# Patient Record
Sex: Male | Born: 1965 | Race: Black or African American | Hispanic: No | Marital: Married | Smoking: Never smoker
Health system: Southern US, Community
[De-identification: ages and names within clinical notes are randomized; demographics above are authoritative.]

## PROBLEM LIST (undated history)

## (undated) DIAGNOSIS — G4730 Sleep apnea, unspecified: Secondary | ICD-10-CM

## (undated) DIAGNOSIS — M1990 Unspecified osteoarthritis, unspecified site: Secondary | ICD-10-CM

## (undated) DIAGNOSIS — D126 Benign neoplasm of colon, unspecified: Secondary | ICD-10-CM

## (undated) DIAGNOSIS — I10 Essential (primary) hypertension: Secondary | ICD-10-CM

## (undated) DIAGNOSIS — M109 Gout, unspecified: Secondary | ICD-10-CM

## (undated) DIAGNOSIS — E785 Hyperlipidemia, unspecified: Secondary | ICD-10-CM

## (undated) DIAGNOSIS — K5790 Diverticulosis of intestine, part unspecified, without perforation or abscess without bleeding: Secondary | ICD-10-CM

## (undated) DIAGNOSIS — T7840XA Allergy, unspecified, initial encounter: Secondary | ICD-10-CM

## (undated) DIAGNOSIS — M5020 Other cervical disc displacement, unspecified cervical region: Secondary | ICD-10-CM

## (undated) HISTORY — DX: Sleep apnea, unspecified: G47.30

## (undated) HISTORY — DX: Allergy, unspecified, initial encounter: T78.40XA

## (undated) HISTORY — DX: Unspecified osteoarthritis, unspecified site: M19.90

## (undated) HISTORY — DX: Hyperlipidemia, unspecified: E78.5

## (undated) HISTORY — DX: Essential (primary) hypertension: I10

## (undated) HISTORY — DX: Diverticulosis of intestine, part unspecified, without perforation or abscess without bleeding: K57.90

## (undated) HISTORY — PX: SHOULDER SURGERY: SHX246

## (undated) HISTORY — DX: Benign neoplasm of colon, unspecified: D12.6

---

## 1999-04-14 ENCOUNTER — Emergency Department (HOSPITAL_COMMUNITY): Admission: EM | Admit: 1999-04-14 | Discharge: 1999-04-14 | Payer: Self-pay | Admitting: Emergency Medicine

## 1999-04-15 ENCOUNTER — Encounter: Payer: Self-pay | Admitting: Emergency Medicine

## 2002-09-03 ENCOUNTER — Ambulatory Visit (HOSPITAL_COMMUNITY): Admission: RE | Admit: 2002-09-03 | Discharge: 2002-09-03 | Payer: Self-pay | Admitting: Sports Medicine

## 2005-11-07 ENCOUNTER — Ambulatory Visit (HOSPITAL_BASED_OUTPATIENT_CLINIC_OR_DEPARTMENT_OTHER): Admission: RE | Admit: 2005-11-07 | Discharge: 2005-11-07 | Payer: Self-pay | Admitting: Family Medicine

## 2005-11-13 ENCOUNTER — Ambulatory Visit: Payer: Self-pay | Admitting: Internal Medicine

## 2005-12-07 ENCOUNTER — Ambulatory Visit (HOSPITAL_BASED_OUTPATIENT_CLINIC_OR_DEPARTMENT_OTHER): Admission: RE | Admit: 2005-12-07 | Discharge: 2005-12-07 | Payer: Self-pay | Admitting: Family Medicine

## 2008-10-02 ENCOUNTER — Emergency Department (HOSPITAL_COMMUNITY): Admission: EM | Admit: 2008-10-02 | Discharge: 2008-10-02 | Payer: Self-pay | Admitting: Emergency Medicine

## 2009-04-30 ENCOUNTER — Ambulatory Visit (HOSPITAL_BASED_OUTPATIENT_CLINIC_OR_DEPARTMENT_OTHER): Admission: RE | Admit: 2009-04-30 | Discharge: 2009-05-01 | Payer: Self-pay | Admitting: Orthopedic Surgery

## 2010-11-19 LAB — BASIC METABOLIC PANEL
BUN: 6 mg/dL (ref 6–23)
CO2: 30 mEq/L (ref 19–32)
Calcium: 9.7 mg/dL (ref 8.4–10.5)
Chloride: 105 mEq/L (ref 96–112)
Creatinine, Ser: 0.94 mg/dL (ref 0.4–1.5)
GFR calc Af Amer: >60 mL/min (ref >60)
GFR calc non Af Amer: >60 mL/min (ref >60)
Glucose, Bld: 103 mg/dL — ABNORMAL HIGH (ref 70–99)
Potassium: 4.5 mEq/L (ref 3.5–5.1)
Sodium: 141 mEq/L (ref 135–145)

## 2010-11-19 LAB — POCT HEMOGLOBIN-HEMACUE: Hemoglobin: 16 g/dL (ref 13.0–17.0)

## 2010-12-31 NOTE — Procedures (Signed)
NAME:  Harold Myers, Harold Myers            ACCOUNT NO.:  [id number]   MEDICAL RECORD NO.:  [id number]          PATIENT TYPE:  OUT   LOCATION:  SLEEP CENTER                 FACILITY:  [facility]   PHYSICIAN:  Clinton D. Maple Hudson, M.D. DATE OF BIRTH:  11-12-1965   DATE OF STUDY:                              NOCTURNAL POLYSOMNOGRAM   REFERRING PHYSICIAN:  Dr. Tracey Harries.   INDICATION FOR STUDY:  Hypersomnia with sleep apnea.   EPWORTH SLEEPINESS SCORE:  07/24, BMI 32.5.  Weight 220 pounds.   MEDICATIONS:  No medication listed.  A baseline diagnostic NPSG on November 07, 2005, recorded an AHI of 113.8 per hour.  CPAP titration is requested.   SLEEP ARCHITECTURE:  Total sleep time 304 minutes with sleep efficiency 81%.  Stage 1 was 5%, stage 2 84%, stages 3 and 4 were absent, REM 11% of total  sleep time.  Sleep latency 48 minutes, REM latency 106 minutes, awake after  sleep onset 26 minutes, arousal index increased at 54.  No bedtime  medication taken.   RESPIRATORY DATA:  CPAP titration protocol.  CPAP was titrated to 15 CWP,  AHI 0 per hour.  A large ResMed ultra mirage full-face mask was used with a  heated humidifier.   OXYGEN DATA:  Snoring was prevented and oxygen saturation held at 94-98% on  room air with CPAP.   CARDIAC DATA:  Normal sinus rhythm.   MOVEMENT/PARASOMNIA:  Occasional limb jerks but with little effect on sleep.   IMPRESSION/RECOMMENDATION:  1.  Successful CPAP titration to 15 CWP, AHI 0 per hour.  A large ResMed      ultra mirage full-face mask was used with a heated humidifier.  2.  Baseline NPSG on November 07, 2005 had recorded an AHI of 113.8 per hour.      Clinton D. Maple Hudson, M.D.  Diplomate, Biomedical engineer of Sleep Medicine  Electronically Signed     CDY/MEDQ  D:  12/11/2005 12:18:12  T:  12/12/2005 10:53:41  Job:  [id number]

## 2010-12-31 NOTE — Procedures (Signed)
NAME:  Harold Myers, BALOGH            ACCOUNT NO.:  [id number]   MEDICAL RECORD NO.:  [id number]          PATIENT TYPE:  OUT   LOCATION:  SLEEP CENTER                 [facility]ACILITY:  [facility]   PHYSICIAN:  Clinton D. Maple Hudson, M.D. DATE OF BIRTH:  [birth year]   DATE OF STUDY:  11/07/2005                              NOCTURNAL POLYSOMNOGRAM   REFERRING PHYSICIAN:  Tracey Harries, M.D.   INDICATIONS FOR STUDY:  Hypersomnia with sleep apnea.  Epworth sleepiness  score 8/24, BMI 32.  Weight 215 pounds.   HOME MEDICATIONS:  None listed.   An NPSG diagnostic protocol was ordered.   SLEEP ARCHITECTURE:  Total sleep time 379 minutes with sleep efficiency 86%.  Stage I was 24%, stage II 72%, stages III and IV absent, REM 4% of total  sleep time.  Sleep latency 24 minutes, REM latency 266 minutes, awake after  sleep onset 38 minutes, arousal index markedly increased at 98.8 per hour  indicating severe sleep fragmentation.  No bedtime medication taken.   RESPIRATORY DATA:  NPSG protocol.  Apnea/hypopnea index (AHI, RDI) 113.8  obstructive events per hour indicating very severe obstructive sleep  apnea/hypopnea syndrome.  This included 584 obstructive apneas and 136  hypopneas.  Events were not positional.  REM AHI 95.3 per hour.   OXYGEN DATA:  Loud to very loud snoring with oxygen desaturation to a nadir  of 77% on room air.  Mean oxygen saturation across the study was 91% on room  air.   CARDIAC DATA:  Normal sinus rhythm.   MOVEMENT/PARASOMNIA:  Occasional leg jerk, insignificant with no bathroom  trips.   IMPRESSION/RECOMMENDATIONS:  1.  Very severe obstructive sleep apnea slash hypopnea syndrome, AHI 113.8      obstructive events per hour with      nonpositional events, very loud snoring and oxygen desaturation to 77%.  2.  Consider return for CPAP titration or evaluate for alternative therapies      as appropriate.      Clinton D. Maple Hudson, M.D.  Diplomate, Biomedical engineer of Sleep Medicine  Electronically Signed     CDY/MEDQ  D:  11/13/2005 11:50:24  T:  11/14/2005 11:40:30  Job:  [id number]

## 2015-07-22 ENCOUNTER — Encounter: Payer: Self-pay | Admitting: Internal Medicine

## 2015-09-09 ENCOUNTER — Ambulatory Visit (AMBULATORY_SURGERY_CENTER): Payer: Self-pay | Admitting: *Deleted

## 2015-09-09 VITALS — Ht 70.0 in | Wt 238.2 lb

## 2015-09-09 DIAGNOSIS — Z1211 Encounter for screening for malignant neoplasm of colon: Secondary | ICD-10-CM

## 2015-09-09 MED ORDER — NA SULFATE-K SULFATE-MG SULF 17.5-3.13-1.6 GM/177ML PO SOLN: ORAL | Status: DC

## 2015-09-09 NOTE — Progress Notes (Signed)
No egg or soy allergy  No anesthesia or intubation problems per pt  No diet medications taken   

## 2015-09-23 ENCOUNTER — Encounter: Payer: Self-pay | Admitting: Internal Medicine

## 2015-09-23 ENCOUNTER — Ambulatory Visit (AMBULATORY_SURGERY_CENTER): Payer: Medicare Other | Admitting: Internal Medicine

## 2015-09-23 VITALS — BP 129/80 | HR 68 | Temp 97.7°F | Resp 16 | Ht 70.0 in | Wt 238.0 lb

## 2015-09-23 DIAGNOSIS — D124 Benign neoplasm of descending colon: Secondary | ICD-10-CM

## 2015-09-23 DIAGNOSIS — D123 Benign neoplasm of transverse colon: Secondary | ICD-10-CM

## 2015-09-23 DIAGNOSIS — Z1211 Encounter for screening for malignant neoplasm of colon: Secondary | ICD-10-CM

## 2015-09-23 DIAGNOSIS — K635 Polyp of colon: Secondary | ICD-10-CM

## 2015-09-23 MED ORDER — SODIUM CHLORIDE 0.9 % IV SOLN: 500.0000 mL | INTRAVENOUS | Status: DC

## 2015-09-23 NOTE — Progress Notes (Signed)
Stable to RR 

## 2015-09-23 NOTE — Progress Notes (Signed)
No problems noted in the recovery room. maw 

## 2015-09-23 NOTE — Op Note (Signed)
[facility]  [address] [place], [id number]   COLONOSCOPY PROCEDURE REPORT  PATIENT: Harold Myers, Harold Myers  MR#: [id number] BIRTHDATE: [birth year] , 80  yrs. old GENDER: male ENDOSCOPIST: Jerene Bears, MD PROCEDURE DATE:  09/23/2015 PROCEDURE:   Colonoscopy, screening and Colonoscopy with snare polypectomy First Screening Colonoscopy - Avg.  risk and is 50 yrs.  old or older Yes.  Prior Negative Screening - Now for repeat screening. N/A  History of Adenoma - Now for follow-up colonoscopy & has been > or = to 3 yrs.  N/A  Polyps removed today? Yes ASA CLASS:   Class II INDICATIONS:Screening for colonic neoplasia and Colorectal Neoplasm Risk Assessment for this procedure is average risk. MEDICATIONS: Monitored anesthesia care and Propofol 400 mg IV  DESCRIPTION OF PROCEDURE:   After the risks benefits and alternatives of the procedure were thoroughly explained, informed consent was obtained.  The digital rectal exam revealed no rectal mass.   The LB PCF Q180 [id number]  endoscope was introduced through the anus and advanced to the cecum, which was identified by both the appendix and ileocecal valve. No adverse events experienced. The quality of the prep was good.  (Suprep was used)  The instrument was then slowly withdrawn as the colon was fully examined. Estimated blood loss is zero unless otherwise noted in this procedure report.  COLON FINDINGS: Two sessile polyps ranging between 3-82mm in size were found in the transverse colon and descending colon. Polypectomies were performed with a cold snare.  The resection was complete, the polyp tissue was completely retrieved and sent to histology.   There was mild diverticulosis noted in the sigmoid colon.  Retroflexed views revealed internal hemorrhoids. The time to cecum = 3.3 Withdrawal time = 13.1   The scope was withdrawn and the procedure completed. COMPLICATIONS: There were no immediate complications.  ENDOSCOPIC  IMPRESSION: 1.   Two sessile polyps ranging between 3-48mm in size were found in the transverse colon and descending colon; polypectomies were performed with a cold snare 2.   Mild diverticulosis was noted in the sigmoid colon  RECOMMENDATIONS: 1.  Await pathology results 2.  High fiber diet 3.  If the polyps removed today are proven to be adenomatous (pre-cancerous) polyps, you will need a repeat colonoscopy in 5 years.  Otherwise you should continue to follow colorectal cancer screening guidelines for "routine risk" patients with colonoscopy in 10 years.  You will receive a letter within 1-2 weeks with the results of your biopsy as well as final recommendations.  Please call my office if you have not received a letter after 3 weeks.  eSigned:  Jerene Bears, MD 09/23/2015 10:02 AM   cc:  the patient

## 2015-09-23 NOTE — Progress Notes (Signed)
Called to room to assist during endoscopic procedure.  Patient ID and intended procedure confirmed with present staff. Received instructions for my participation in the procedure from the performing physician.  

## 2015-09-23 NOTE — Patient Instructions (Signed)
YOU HAD AN ENDOSCOPIC PROCEDURE TODAY AT THE [facility]:   Refer to the procedure report that was given to you for any specific questions about what was found during the examination.  If the procedure report does not answer your questions, please call your gastroenterologist to clarify.  If you requested that your care partner not be given the details of your procedure findings, then the procedure report has been included in a sealed envelope for you to review at your convenience later.  YOU SHOULD EXPECT: Some feelings of bloating in the abdomen. Passage of more gas than usual.  Walking can help get rid of the air that was put into your GI tract during the procedure and reduce the bloating. If you had a lower endoscopy (such as a colonoscopy or flexible sigmoidoscopy) you may notice spotting of blood in your stool or on the toilet paper. If you underwent a bowel prep for your procedure, you may not have a normal bowel movement for a few days.  Please Note:  You might notice some irritation and congestion in your nose or some drainage.  This is from the oxygen used during your procedure.  There is no need for concern and it should clear up in a day or so.  SYMPTOMS TO REPORT IMMEDIATELY:   Following lower endoscopy (colonoscopy or flexible sigmoidoscopy):  Excessive amounts of blood in the stool  Significant tenderness or worsening of abdominal pains  Swelling of the abdomen that is new, acute  Fever of 100F or higher   For urgent or emergent issues, a gastroenterologist can be reached at any hour by calling [phone].   DIET: Your first meal following the procedure should be a small meal and then it is ok to progress to your normal diet. Heavy or fried foods are harder to digest and may make you feel nauseous or bloated.  Likewise, meals heavy in dairy and vegetables can increase bloating.  Drink plenty of fluids but you should avoid alcoholic beverages for 24  hours.  ACTIVITY:  You should plan to take it easy for the rest of today and you should NOT DRIVE or use heavy machinery until tomorrow (because of the sedation medicines used during the test).    FOLLOW UP: Our staff will call the number listed on your records the next business day following your procedure to check on you and address any questions or concerns that you may have regarding the information given to you following your procedure. If we do not reach you, we will leave a message.  However, if you are feeling well and you are not experiencing any problems, there is no need to return our call.  We will assume that you have returned to your regular daily activities without incident.  If any biopsies were taken you will be contacted by phone or by letter within the next 1-3 weeks.  Please call us at [phone] if you have not heard about the biopsies in 3 weeks.    SIGNATURES/CONFIDENTIALITY: You and/or your care partner have signed paperwork which will be entered into your electronic medical record.  These signatures attest to the fact that that the information above on your After Visit Summary has been reviewed and is understood.  Full responsibility of the confidentiality of this discharge information lies with you and/or your care-partner.    Handouts were given to your care partner on polyps, diverticulosis, hemorrhoids, and a high fiber diet with liberal fluid intake.  You   may resume your current medications today. Await biopsy results. Please call if any questions or concerns.   

## 2015-09-24 ENCOUNTER — Telehealth: Payer: Self-pay

## 2015-09-24 NOTE — Telephone Encounter (Signed)
  Follow up Call-  Call back number 09/23/2015  Post procedure Call Back phone  # [phone]  Permission to leave phone message Yes     Patient was called after procedure on 09/23/2015. No answer at the number given for follow up. A message was left on the answering machine.

## 2015-09-28 ENCOUNTER — Encounter: Payer: Self-pay | Admitting: Internal Medicine

## 2020-05-18 ENCOUNTER — Other Ambulatory Visit: Payer: Self-pay | Admitting: General Surgery

## 2020-05-18 DIAGNOSIS — R1013 Epigastric pain: Secondary | ICD-10-CM

## 2020-06-09 ENCOUNTER — Other Ambulatory Visit: Payer: Self-pay

## 2020-06-09 ENCOUNTER — Ambulatory Visit
Admission: RE | Admit: 2020-06-09 | Discharge: 2020-06-09 | Disposition: A | Discharge status: Home / Self Care | Payer: Medicare Other | Source: Ambulatory Visit | Attending: General Surgery | Admitting: General Surgery

## 2020-06-09 DIAGNOSIS — R1013 Epigastric pain: Secondary | ICD-10-CM

## 2020-07-16 ENCOUNTER — Telehealth: Payer: Self-pay | Admitting: Internal Medicine

## 2020-09-18 ENCOUNTER — Encounter: Payer: Self-pay | Admitting: *Deleted

## 2020-09-24 ENCOUNTER — Encounter: Payer: Self-pay | Admitting: Internal Medicine

## 2020-09-24 ENCOUNTER — Ambulatory Visit (INDEPENDENT_AMBULATORY_CARE_PROVIDER_SITE_OTHER): Payer: Medicare Other | Admitting: Internal Medicine

## 2020-09-24 VITALS — BP 139/82 | HR 65 | Ht 70.0 in | Wt 250.6 lb

## 2020-09-24 DIAGNOSIS — Z860100 Personal history of colon polyps, unspecified: Secondary | ICD-10-CM

## 2020-09-24 DIAGNOSIS — K219 Gastro-esophageal reflux disease without esophagitis: Secondary | ICD-10-CM

## 2020-09-24 DIAGNOSIS — R1013 Epigastric pain: Secondary | ICD-10-CM

## 2020-09-24 MED ORDER — SUTAB 1479-225-188 MG PO TABS: ORAL_TABLET | ORAL | 0 refills | Status: DC

## 2020-09-24 NOTE — Patient Instructions (Signed)
You have been scheduled for an endoscopy and colonoscopy. Please follow the written instructions given to you at your visit today. Please pick up your prep supplies at the pharmacy within the next 1-3 days. If you use inhalers (even only as needed), please bring them with you on the day of your procedure.  If you are age 57 or younger, your body mass index should be between 19-25. Your Body mass index is 35.96 kg/m. If this is out of the aformentioned range listed, please consider follow up with your Primary Care Provider.   Due to recent changes in healthcare laws, you may see the results of your imaging and laboratory studies on MyChart before your provider has had a chance to review them.  We understand that in some cases there may be results that are confusing or concerning to you. Not all laboratory results come back in the same time frame and the provider may be waiting for multiple results in order to interpret others.  Please give [place] 48 hours in order for your provider to thoroughly review all the results before contacting the office for clarification of your results.

## 2020-09-24 NOTE — Progress Notes (Signed)
Patient ID: Harold Myers, male   DOB: 22-Aug-1965, 55 y.o.   MRN: [id number] HPI: Harold Myers is a 55 year old male with a history of nonadvanced adenoma of the colon, diverticulosis, hypertension, hyperlipidemia and sleep apnea who is seen to evaluate epigastric discomfort and abdominal bloating.  He is here alone today.  He is known to me from his screening colonoscopy which was performed 5 years ago on 09/23/2015.  This revealed 2 polyps ranging in size from 3 to 5 mm.  One from the transverse the other from the descending colon.  1 of these was an adenoma.  The other was benign colorectal mucosa.  There was sigmoid diverticulosis.  He reports that he has developed epigastric discomfort and "tightness".  This seems to be more prevalent after eating but also after doing heavy lifting and yard work.  He was using ice packs for relief.  He does take famotidine 20 mg once daily which he takes for history of reflux and regurgitation.  It was previously used twice daily but now once daily.  It does seem to help his heartburn and reflux symptoms.  His bowel movements are "smooth" though he does try to increase fiber in his diet and also uses a detoxification tea at night to help with bowel movement.  No blood in stool or melena.  He does use fiber Gummies on occasion.  He was using garlic and lemon juice to help cholesterol though he noticed this may have worsened his epigastric discomfort.  No dysphagia or odynophagia.  In the last several weeks his upper abdominal tightness and bloating and discomfort have been better but he started a digestive enzyme but she takes twice daily with food.  He was seen by [facility] Dr. Marlou Starks to evaluate abdominal pain.  This was predominantly epigastric and there was question of a possible ventral hernia or diastases recti.  CT scan of the abdomen was performed, see objective.  He reports he does have lab work routinely with primary care though I do not  have record of this lab work currently.  Past Medical History:  Diagnosis Date  . Allergy   . Arthritis   . Diverticulosis   . Hyperlipidemia   . Hypertension   . Sleep apnea    wears CPAP  . Tubular adenoma of colon     Past Surgical History:  Procedure Laterality Date  . SHOULDER SURGERY Left    rotator cuff, bone spur- left shoulder    Outpatient Medications Prior to Visit  Medication Sig Dispense Refill  . aspirin 81 MG chewable tablet Chew 81 mg by mouth daily.    . ergocalciferol (VITAMIN D2) 50000 units capsule Take 50,000 Units by mouth once a week.    . fluticasone (FLONASE) 50 MCG/ACT nasal spray Place 2 sprays into both nostrils daily.    Marland Kitchen gabapentin (NEURONTIN) 300 MG capsule Take 300 mg by mouth 3 (three) times daily as needed.    Marland Kitchen ibuprofen (ADVIL,MOTRIN) 800 MG tablet Take 800 mg by mouth every 8 (eight) hours as needed.    . loratadine (CLARITIN) 10 MG tablet Take 10 mg by mouth daily. Reported on 09/23/2015    . losartan (COZAAR) 50 MG tablet Take 50 mg by mouth daily.    . meloxicam (MOBIC) 15 MG tablet Take 15 mg by mouth daily. Reported on 09/23/2015    . simvastatin (ZOCOR) 20 MG tablet Take 20 mg by mouth daily at 6 PM.    . colchicine 0.6  MG tablet Take 0.6 mg by mouth 2 (two) times daily. Reported on 09/23/2015 (Patient not taking: Reported on 09/24/2020)    . lisinopril (PRINIVIL,ZESTRIL) 40 MG tablet Take 40 mg by mouth daily.    . traMADol (ULTRAM) 50 MG tablet Take by mouth every 12 (twelve) hours as needed. Reported on 09/23/2015 (Patient not taking: Reported on 09/24/2020)     No facility-administered medications prior to visit.    No Known Allergies  Family History  Problem Relation Age of Onset  . Arthritis Mother   . Breast cancer Mother   . Diabetes Mother   . Heart disease Mother   . Arthritis Father   . Arthritis Sister   . Breast cancer Sister   . Alcohol abuse Brother   . Arthritis Brother   . Diabetes Brother   . Heart disease Brother    . Prostate cancer Brother   . Colon cancer Neg Hx   . Esophageal cancer Neg Hx   . Rectal cancer Neg Hx   . Stomach cancer Neg Hx     Social History   Tobacco Use  . Smoking status: Never Smoker  . Smokeless tobacco: Never Used  Substance Use Topics  . Alcohol use: Not Currently    Comment: occasional beer or wine  . Drug use: No    ROS: As per history of present illness, otherwise negative  Ht 5\' 10"  (1.778 m)   Wt 250 lb 9.6 oz (113.7 kg)   BMI 35.96 kg/m  Gen: awake, alert, NAD HEENT: anicteric  CV: RRR, no mrg Pulm: CTA b/l Abd: soft, NT/ND, +BS throughout Ext: no c/c/e Neuro: nonfocal   RELEVANT LABS AND IMAGING: CT ABDOMEN AND PELVIS WITHOUT CONTRAST   TECHNIQUE: Multidetector CT imaging of the abdomen and pelvis was performed following the standard protocol without IV contrast.   COMPARISON:  None.   FINDINGS: Evaluation of this exam is limited in the absence of intravenous contrast.   Lower chest: The visualized lung bases are clear.   No intra-abdominal free air or free fluid.   Hepatobiliary: No focal liver abnormality is seen. No gallstones, gallbladder wall thickening, or biliary dilatation.   Pancreas: Unremarkable. No pancreatic ductal dilatation or surrounding inflammatory changes.   Spleen: Normal in size without focal abnormality.   Adrenals/Urinary Tract: The adrenal glands unremarkable. There is no hydronephrosis or obstructing stone on either side. Punctate nonobstructing left renal inferior pole calculus may be present. Several small left renal hypodense lesions are not characterized on this CT, possibly cysts. Ultrasound may provide better evaluation. The visualized ureter and urinary bladder appear unremarkable.   Stomach/Bowel: Several small scattered sigmoid diverticula without active inflammatory changes. There is no bowel obstruction or active inflammation. Diffuse thickened appearance of the colon, likely related to  underdistention. The appendix is normal.   Vascular/Lymphatic: The abdominal aorta and IVC are grossly unremarkable on this noncontrast CT. No portal venous gas. There is no adenopathy.   Reproductive: The prostate and seminal vesicles are grossly unremarkable. No pelvic mass.   Other: None   Musculoskeletal: No acute or significant osseous findings.   IMPRESSION: 1. No acute intra-abdominal or pelvic pathology. No bowel obstruction. Normal appendix. 2. Small scattered sigmoid diverticula. 3. No hydronephrosis or obstructing stone.     Electronically Signed   By: Anner Crete M.D.   On: 06/10/2020 21:45    ASSESSMENT/PLAN: 55 year old male with a history of nonadvanced adenoma of the colon, diverticulosis, hypertension, hyperlipidemia and sleep apnea who is seen  to evaluate epigastric discomfort and abdominal bloating.   1.  Epigastric pain/pressure --CT scan was unremarkable.  He is on low-dose acid suppression with once daily famotidine 20 mg.  This does not sound classically like GERD.  I recommended upper endoscopy --EGD in the [place]  2.  History of adenomatous colon polyp --surveillance colonoscopy recommended.  We discussed the risk, benefits and alternatives and he is agreeable and wishes to proceed --Colonoscopy in the [facility]  3.  GERD --currently well controlled famotidine 20 mg daily.  Depending on results of upper endoscopy we may consider a trial of PPI thereafter for problem #1       Cc:Nolene Ebbs, [address] [place],  [place] [id number]

## 2020-10-16 ENCOUNTER — Encounter (HOSPITAL_COMMUNITY): Payer: Self-pay

## 2020-10-16 ENCOUNTER — Ambulatory Visit (HOSPITAL_COMMUNITY)
Admission: EM | Admit: 2020-10-16 | Discharge: 2020-10-16 | Disposition: A | Discharge status: Home / Self Care | Payer: Medicare Other | Attending: Internal Medicine | Admitting: Internal Medicine

## 2020-10-16 ENCOUNTER — Other Ambulatory Visit: Payer: Self-pay

## 2020-10-16 DIAGNOSIS — Z7982 Long term (current) use of aspirin: Secondary | ICD-10-CM | POA: Insufficient documentation

## 2020-10-16 DIAGNOSIS — K2900 Acute gastritis without bleeding: Secondary | ICD-10-CM

## 2020-10-16 DIAGNOSIS — Z79899 Other long term (current) drug therapy: Secondary | ICD-10-CM | POA: Insufficient documentation

## 2020-10-16 DIAGNOSIS — R1013 Epigastric pain: Secondary | ICD-10-CM | POA: Diagnosis present

## 2020-10-16 LAB — BASIC METABOLIC PANEL
Anion gap: 11 (ref 5–15)
BUN: <5 mg/dL — ABNORMAL LOW (ref 6–20)
CO2: 22 mmol/L (ref 22–32)
Calcium: 9.6 mg/dL (ref 8.9–10.3)
Chloride: 105 mmol/L (ref 98–111)
Creatinine, Ser: 0.94 mg/dL (ref 0.61–1.24)
GFR, Estimated: >60 mL/min (ref >60)
Glucose, Bld: 109 mg/dL — ABNORMAL HIGH (ref 70–99)
Potassium: 4.6 mmol/L (ref 3.5–5.1)
Sodium: 138 mmol/L (ref 135–145)

## 2020-10-16 LAB — CBC WITH DIFFERENTIAL/PLATELET
Abs Immature Granulocytes: 0.02 10*3/uL (ref 0.00–0.07)
Basophils Absolute: 0 10*3/uL (ref 0.0–0.1)
Basophils Relative: 0 %
Eosinophils Absolute: 0 10*3/uL (ref 0.0–0.5)
Eosinophils Relative: 1 %
HCT: 49.1 % (ref 39.0–52.0)
Hemoglobin: 15.9 g/dL (ref 13.0–17.0)
Immature Granulocytes: 0 %
Lymphocytes Relative: 30 %
Lymphs Abs: 1.5 10*3/uL (ref 0.7–4.0)
MCH: 26.9 pg (ref 26.0–34.0)
MCHC: 32.4 g/dL (ref 30.0–36.0)
MCV: 83.1 fL (ref 80.0–100.0)
Monocytes Absolute: 0.3 10*3/uL (ref 0.1–1.0)
Monocytes Relative: 6 %
Neutro Abs: 3.1 10*3/uL (ref 1.7–7.7)
Neutrophils Relative %: 63 %
Platelets: 142 10*3/uL — ABNORMAL LOW (ref 150–400)
RBC: 5.91 MIL/uL — ABNORMAL HIGH (ref 4.22–5.81)
RDW: 14.7 % (ref 11.5–15.5)
WBC: 5 10*3/uL (ref 4.0–10.5)
nRBC: 0 % (ref 0.0–0.2)

## 2020-10-16 LAB — PSA: Prostatic Specific Antigen: 2.56 ng/mL (ref 0.00–4.00)

## 2020-10-16 MED ORDER — PANTOPRAZOLE SODIUM 20 MG PO TBEC: 20.0000 mg | DELAYED_RELEASE_TABLET | Freq: Every day | ORAL | 1 refills | Status: DC

## 2020-10-16 NOTE — ED Triage Notes (Signed)
Pt presets with intermittent abdominal after eating x 6 months. States he feels his digestion is slow. States Pepto Bismol gives no relief and stools are dark after he takes it.   Pt states he stopped taking sildenafil as he saw abdominal pain is a side effect.

## 2020-10-16 NOTE — Discharge Instructions (Addendum)
These take medications as prescribed Keep your colonoscopy/EGD appointment If you experience worsening abdominal pain, persistent nausea/vomiting please return to urgent care to be reevaluated

## 2020-10-16 NOTE — ED Provider Notes (Signed)
[place]    CSN: [id number] Arrival date & time: 10/16/20  1641      History   Chief Complaint Chief Complaint  Patient presents with  . Abdominal Pain    HPI Harold Myers is a 55 y.o. male comes to the urgent care with complaints of intermittent epigastric pain over the past 6 months.  Patient describes pain as throbbing and more of a discomfort.  It is aggravated by eating.  No known relieving factors.  He does not wake up at night because of the pain.  No vomiting.  No change in bowel movements.  No weight loss.   Patient has been taking famotidine with no improvement in his symptoms.  He is scheduled to have EGD/colonoscopy sometime in April of this year.  No alcohol use or over-the-counter pain medication use.  Patient denies using NSAIDs.  Patient denies any blood in stool but he says his stool has been somewhat red.  He has been eating a lot of beets.  HPI  Past Medical History:  Diagnosis Date  . Allergy   . Arthritis   . Diverticulosis   . Hyperlipidemia   . Hypertension   . Sleep apnea    wears CPAP  . Tubular adenoma of colon     There are no problems to display for this patient.   Past Surgical History:  Procedure Laterality Date  . SHOULDER SURGERY Left    rotator cuff, bone spur- left shoulder       Home Medications    Prior to Admission medications   Medication Sig Start Date End Date Taking? Authorizing Provider  acetaminophen (TYLENOL) 500 MG tablet Take 500 mg by mouth every 6 (six) hours as needed.   Yes [provider]  Lactobacillus-Inulin (CULTURELLE DIGESTIVE DAILY PO) Take by mouth.   Yes [provider]  pantoprazole (PROTONIX) 20 MG tablet Take 1 tablet (20 mg total) by mouth daily. 10/16/20  Yes Reesa Gotschall, Myrene Galas, MD  sildenafil (VIAGRA) 100 MG tablet Take 100 mg by mouth daily as needed for erectile dysfunction.   Yes [provider]  aspirin 81 MG chewable tablet Chew 81 mg by mouth daily.     [provider]  bisacodyl (DULCOLAX) 5 MG EC tablet Take 5 mg by mouth daily as needed for moderate constipation.    [provider]  Colchicine (MITIGARE) 0.6 MG CAPS Take by mouth 2 (two) times daily.    [provider]  Digestive Enzymes (ENZYME DIGEST PO) Take by mouth 2 (two) times daily.    [provider]  ergocalciferol (VITAMIN D2) 50000 units capsule Take 50,000 Units by mouth once a week.    [provider]  febuxostat (ULORIC) 40 MG tablet Take 40 mg by mouth daily.    [provider]  fluticasone (FLONASE) 50 MCG/ACT nasal spray Place 2 sprays into both nostrils daily.    [provider]  gabapentin (NEURONTIN) 300 MG capsule Take 300 mg by mouth 3 (three) times daily as needed.    [provider]  loratadine (CLARITIN) 10 MG tablet Take 10 mg by mouth daily. Reported on 09/23/2015    [provider]  losartan (COZAAR) 50 MG tablet Take 50 mg by mouth daily. 07/22/20   [provider]  simvastatin (ZOCOR) 20 MG tablet Take 20 mg by mouth daily at 6 PM.    [provider]  famotidine (PEPCID) 20 MG tablet Take 20 mg by mouth daily.  10/16/20  [provider]    Family History Family History  Problem Relation Age of Onset  . Arthritis Mother   . Breast cancer Mother   . Diabetes Mother   . Heart disease Mother   . Arthritis Father   . Arthritis Sister   . Breast cancer Sister   . Alcohol abuse Brother   . Arthritis Brother   . Diabetes Brother   . Heart disease Brother   . Prostate cancer Brother   . Colon cancer Neg Hx   . Esophageal cancer Neg Hx   . Rectal cancer Neg Hx   . Stomach cancer Neg Hx     Social History Social History   Tobacco Use  . Smoking status: Never Smoker  . Smokeless tobacco: Never Used  Substance Use Topics  . Alcohol use: Not Currently    Comment: occasional beer or wine  . Drug use: No     Allergies   Patient has no known  allergies.   Review of Systems Review of Systems  HENT: Negative.   Respiratory: Negative.   Gastrointestinal: Positive for abdominal pain. Negative for abdominal distention, blood in stool, diarrhea, nausea and vomiting.  Genitourinary: Negative.      Physical Exam Triage Vital Signs ED Triage Vitals [10/16/20 1657]  Enc Vitals Group     BP (!) 146/92     Pulse Rate 79     Resp 18     Temp 99.5 F (37.5 C)     Temp Source Oral     SpO2 100 %     Weight      Height      Head Circumference      Peak Flow      Pain Score 5     Pain Loc      Pain Edu?      Excl. in [place]?    No data found.  Updated Vital Signs BP (!) 146/92 (BP Location: Right Arm)   Pulse 79   Temp 99.5 F (37.5 C) (Oral)   Resp 18   SpO2 100%   Visual Acuity Right Eye Distance:   Left Eye Distance:   Bilateral Distance:    Right Eye Near:   Left Eye Near:    Bilateral Near:     Physical Exam   UC Treatments / Results  Labs (all labs ordered are listed, but only abnormal results are displayed) Labs Reviewed  CBC WITH DIFFERENTIAL/PLATELET  BASIC METABOLIC PANEL  PSA    EKG   Radiology No results found.  Procedures Procedures (including critical care time)  Medications Ordered in UC Medications - No data to display  Initial Impression / Assessment and Plan / UC Course  I have reviewed the triage vital signs and the nursing notes.  Pertinent labs & imaging results that were available during my care of the patient were reviewed by me and considered in my medical decision making (see chart for details).     1.  Acute superficial gastritis without hemorrhage: Protonix 20 mg orally daily Patient encouraged to keep his GI appointment. CBC, BMP. Patient wants PSA checked-no prostatic symptoms Return to urgent care if symptoms worsen. Final Clinical Impressions(s) / UC Diagnoses   Final diagnoses:  Acute superficial gastritis without hemorrhage     Discharge  Instructions     These take medications as prescribed Keep your colonoscopy/EGD appointment If you experience worsening abdominal pain, persistent nausea/vomiting please return to urgent care to be reevaluated  ED Prescriptions    Medication Sig Dispense Auth. Provider   pantoprazole (PROTONIX) 20 MG tablet Take 1 tablet (20 mg total) by mouth daily. 30 tablet Edwin Baines, Myrene Galas, MD     PDMP not reviewed this encounter.   Chase Picket, MD 10/16/20 [phone]

## 2020-11-05 ENCOUNTER — Other Ambulatory Visit: Payer: Self-pay | Admitting: Internal Medicine

## 2020-11-05 LAB — SARS CORONAVIRUS 2 (TAT 6-24 HRS): SARS Coronavirus 2: NEGATIVE

## 2020-11-09 ENCOUNTER — Other Ambulatory Visit: Payer: Self-pay

## 2020-11-09 ENCOUNTER — Ambulatory Visit (AMBULATORY_SURGERY_CENTER): Payer: Medicare Other | Admitting: Internal Medicine

## 2020-11-09 ENCOUNTER — Encounter: Payer: Self-pay | Admitting: Internal Medicine

## 2020-11-09 VITALS — BP 110/62 | HR 78 | Temp 98.3°F | Resp 13 | Ht 70.0 in | Wt 250.0 lb

## 2020-11-09 DIAGNOSIS — D123 Benign neoplasm of transverse colon: Secondary | ICD-10-CM

## 2020-11-09 DIAGNOSIS — R1013 Epigastric pain: Secondary | ICD-10-CM

## 2020-11-09 DIAGNOSIS — K319 Disease of stomach and duodenum, unspecified: Secondary | ICD-10-CM | POA: Diagnosis not present

## 2020-11-09 DIAGNOSIS — K2970 Gastritis, unspecified, without bleeding: Secondary | ICD-10-CM

## 2020-11-09 DIAGNOSIS — K259 Gastric ulcer, unspecified as acute or chronic, without hemorrhage or perforation: Secondary | ICD-10-CM

## 2020-11-09 DIAGNOSIS — Z86010 Personal history of colonic polyps: Secondary | ICD-10-CM

## 2020-11-09 DIAGNOSIS — K2900 Acute gastritis without bleeding: Secondary | ICD-10-CM

## 2020-11-09 DIAGNOSIS — K219 Gastro-esophageal reflux disease without esophagitis: Secondary | ICD-10-CM

## 2020-11-09 MED ORDER — SODIUM CHLORIDE 0.9 % IV SOLN: 500.0000 mL | Freq: Once | INTRAVENOUS | Status: DC

## 2020-11-09 NOTE — Progress Notes (Signed)
Report to PACU, RN, vss, BBS= Clear.  

## 2020-11-09 NOTE — Op Note (Signed)
[place] Patient Name: Harold Myers Procedure Date: 11/09/2020 3:13 PM MRN: [id number] Endoscopist: Jerene Bears , MD Age: 55 Referring MD:  Date of Birth: Dec 10, 1965 Gender: Male Account #: [id number] Procedure:                Colonoscopy Indications:              High risk colon cancer surveillance: Personal                            history of non-advanced adenoma, Last colonoscopy:                            February 2017 Medicines:                Monitored Anesthesia Care Procedure:                Pre-Anesthesia Assessment:                           - Prior to the procedure, a History and Physical                            was performed, and patient medications and                            allergies were reviewed. The patient's tolerance of                            previous anesthesia was also reviewed. The risks                            and benefits of the procedure and the sedation                            options and risks were discussed with the patient.                            All questions were answered, and informed consent                            was obtained. Prior Anticoagulants: The patient has                            taken no previous anticoagulant or antiplatelet                            agents. ASA Grade Assessment: II - A patient with                            mild systemic disease. After reviewing the risks                            and benefits, the patient was deemed in  satisfactory condition to undergo the procedure.                           - Prior to the procedure, a History and Physical                            was performed, and patient medications and                            allergies were reviewed. The patient's tolerance of                            previous anesthesia was also reviewed. The risks                            and benefits of the procedure and the sedation                             options and risks were discussed with the patient.                            All questions were answered, and informed consent                            was obtained. Prior Anticoagulants: The patient has                            taken no previous anticoagulant or antiplatelet                            agents. ASA Grade Assessment: II - A patient with                            mild systemic disease. After reviewing the risks                            and benefits, the patient was deemed in                            satisfactory condition to undergo the procedure.                           After obtaining informed consent, the colonoscope                            was passed under direct vision. Throughout the                            procedure, the patient's blood pressure, pulse, and                            oxygen saturations were monitored continuously. The  Olympus [id number]) Colonoscope was                            introduced through the anus and advanced to the                            cecum, identified by appendiceal orifice and                            ileocecal valve. The colonoscopy was performed                            without difficulty. The patient tolerated the                            procedure well. The quality of the bowel                            preparation was good. The ileocecal valve,                            appendiceal orifice, and rectum were photographed. Scope In: 3:28:46 PM Scope Out: 3:40:59 PM Scope Withdrawal Time: 0 hours 10 minutes 43 seconds  Total Procedure Duration: 0 hours 12 minutes 13 seconds  Findings:                 The digital rectal exam was normal.                           A 5 mm polyp was found in the transverse colon. The                            polyp was sessile. The polyp was removed with a                            cold snare. Resection and retrieval were  complete.                           Multiple small-mouthed diverticula were found in                            the sigmoid colon.                           The retroflexed view of the distal rectum and anal                            verge was normal and showed no anal or rectal                            abnormalities. Complications:            No immediate complications. Estimated Blood Loss:     Estimated blood loss was minimal. Impression:               -  One 5 mm polyp in the transverse colon, removed                            with a cold snare. Resected and retrieved.                           - Diverticulosis in the sigmoid colon.                           - The distal rectum and anal verge are normal on                            retroflexion view. Recommendation:           - Patient has a contact number available for                            emergencies. The signs and symptoms of potential                            delayed complications were discussed with the                            patient. Return to normal activities tomorrow.                            Written discharge instructions were provided to the                            patient.                           - Resume previous diet.                           - Continue present medications.                           - Await pathology results.                           - Repeat colonoscopy is recommended for                            surveillance. The colonoscopy date will be                            determined after pathology results from today's                            exam become available for review. Jerene Bears, MD 11/09/2020 3:44:54 PM This report has been signed electronically.

## 2020-11-09 NOTE — Op Note (Signed)
[place] Patient Name: Harold Myers Procedure Date: 11/09/2020 3:13 PM MRN: [id number] Endoscopist: Jerene Bears , MD Age: 55 Referring MD:  Date of Birth: 02-21-1966 Gender: Male Account #: [id number] Procedure:                Upper GI endoscopy Indications:              Epigastric abdominal pain and pressure, hx of GERD Medicines:                Monitored Anesthesia Care Procedure:                Pre-Anesthesia Assessment:                           - Prior to the procedure, a History and Physical                            was performed, and patient medications and                            allergies were reviewed. The patient's tolerance of                            previous anesthesia was also reviewed. The risks                            and benefits of the procedure and the sedation                            options and risks were discussed with the patient.                            All questions were answered, and informed consent                            was obtained. Prior Anticoagulants: The patient has                            taken no previous anticoagulant or antiplatelet                            agents. ASA Grade Assessment: II - A patient with                            mild systemic disease. After reviewing the risks                            and benefits, the patient was deemed in                            satisfactory condition to undergo the procedure.                           After obtaining informed consent, the endoscope was  passed under direct vision. Throughout the                            procedure, the patient's blood pressure, pulse, and                            oxygen saturations were monitored continuously. The                            Endoscope was introduced through the mouth, and                            advanced to the second part of duodenum. The upper                            GI  endoscopy was accomplished without difficulty.                            The patient tolerated the procedure well. Scope In: Scope Out: Findings:                 The examined esophagus was normal.                           Moderate inflammation characterized by adherent                            blood, erosions and erythema was found in the                            gastric body and in the gastric antrum. Biopsies                            were taken with a cold forceps for histology and                            Helicobacter pylori testing.                           The cardia and gastric fundus were normal on                            retroflexion.                           The examined duodenum was normal. Complications:            No immediate complications. Estimated Blood Loss:     Estimated blood loss was minimal. Impression:               - Normal esophagus.                           - Gastritis. Biopsied.                           - Normal examined duodenum.  Recommendation:           - Patient has a contact number available for                            emergencies. The signs and symptoms of potential                            delayed complications were discussed with the                            patient. Return to normal activities tomorrow.                            Written discharge instructions were provided to the                            patient.                           - Resume previous diet.                           - Continue present medications.                           - Await pathology results. Jerene Bears, MD 11/09/2020 3:43:19 PM This report has been signed electronically.

## 2020-11-09 NOTE — Patient Instructions (Signed)
YOU HAD AN ENDOSCOPIC PROCEDURE TODAY AT THE [facility]:   Refer to the procedure report that was given to you for any specific questions about what was found during the examination.  If the procedure report does not answer your questions, please call your gastroenterologist to clarify.  If you requested that your care partner not be given the details of your procedure findings, then the procedure report has been included in a sealed envelope for you to review at your convenience later.  YOU SHOULD EXPECT: Some feelings of bloating in the abdomen. Passage of more gas than usual.  Walking can help get rid of the air that was put into your GI tract during the procedure and reduce the bloating. If you had a lower endoscopy (such as a colonoscopy or flexible sigmoidoscopy) you may notice spotting of blood in your stool or on the toilet paper. If you underwent a bowel prep for your procedure, you may not have a normal bowel movement for a few days.  Please Note:  You might notice some irritation and congestion in your nose or some drainage.  This is from the oxygen used during your procedure.  There is no need for concern and it should clear up in a day or so.  SYMPTOMS TO REPORT IMMEDIATELY:   Following lower endoscopy (colonoscopy or flexible sigmoidoscopy):  Excessive amounts of blood in the stool  Significant tenderness or worsening of abdominal pains  Swelling of the abdomen that is new, acute  Fever of 100F or higher   Following upper endoscopy (EGD)  Vomiting of blood or coffee ground material  New chest pain or pain under the shoulder blades  Painful or persistently difficult swallowing  New shortness of breath  Fever of 100F or higher  Black, tarry-looking stools  For urgent or emergent issues, a gastroenterologist can be reached at any hour by calling [phone]. Do not use MyChart messaging for urgent concerns.    DIET:  We do recommend a small meal at first, but  then you may proceed to your regular diet.  Drink plenty of fluids but you should avoid alcoholic beverages for 24 hours.  ACTIVITY:  You should plan to take it easy for the rest of today and you should NOT DRIVE or use heavy machinery until tomorrow (because of the sedation medicines used during the test).    FOLLOW UP: Our staff will call the number listed on your records 48-72 hours following your procedure to check on you and address any questions or concerns that you may have regarding the information given to you following your procedure. If we do not reach you, we will leave a message.  We will attempt to reach you two times.  During this call, we will ask if you have developed any symptoms of COVID 19. If you develop any symptoms (ie: fever, flu-like symptoms, shortness of breath, cough etc.) before then, please call [phone].  If you test positive for Covid 19 in the 2 weeks post procedure, please call and report this information to us.    If any biopsies were taken you will be contacted by phone or by letter within the next 1-3 weeks.  Please call us at [phone] if you have not heard about the biopsies in 3 weeks.    SIGNATURES/CONFIDENTIALITY: You and/or your care partner have signed paperwork which will be entered into your electronic medical record.  These signatures attest to the fact that that the information above on   your After Visit Summary has been reviewed and is understood.  Full responsibility of the confidentiality of this discharge information lies with you and/or your care-partner. 

## 2020-11-09 NOTE — Progress Notes (Signed)
Called to room to assist during endoscopic procedure.  Patient ID and intended procedure confirmed with present staff. Received instructions for my participation in the procedure from the performing physician.  

## 2020-11-17 ENCOUNTER — Encounter: Payer: Self-pay | Admitting: Internal Medicine

## 2020-12-01 ENCOUNTER — Telehealth: Payer: Self-pay | Admitting: Internal Medicine

## 2020-12-01 NOTE — Telephone Encounter (Signed)
Pt states he is having abdominal pain and rectal pain following the procedure. Reports he has been taking extra strength tylenol but still having discomfort. Pt scheduled to see Amy Esterwood PA 12/04/20@9am . Pt aware of appt.

## 2020-12-01 NOTE — Telephone Encounter (Signed)
Inbound call from patient. Had Endo/Colon on 3/28. States he is having a lot abd, intestine, and rectal pain. Abd is still swollen as well. Have a follow up scheduled for 6/30. Can call him back [phone] if need sooner appointment

## 2020-12-04 ENCOUNTER — Encounter: Payer: Self-pay | Admitting: Physician Assistant

## 2020-12-04 ENCOUNTER — Ambulatory Visit (INDEPENDENT_AMBULATORY_CARE_PROVIDER_SITE_OTHER): Payer: Medicare Other | Admitting: Physician Assistant

## 2020-12-04 VITALS — BP 126/80 | HR 72 | Ht 69.5 in | Wt 230.4 lb

## 2020-12-04 DIAGNOSIS — D369 Benign neoplasm, unspecified site: Secondary | ICD-10-CM | POA: Diagnosis not present

## 2020-12-04 DIAGNOSIS — K2900 Acute gastritis without bleeding: Secondary | ICD-10-CM

## 2020-12-04 DIAGNOSIS — K6289 Other specified diseases of anus and rectum: Secondary | ICD-10-CM | POA: Diagnosis not present

## 2020-12-04 MED ORDER — PANTOPRAZOLE SODIUM 20 MG PO TBEC
20.0000 mg | DELAYED_RELEASE_TABLET | Freq: Every day | ORAL | 2 refills | Status: DC
Start: 2020-12-04 — End: 2021-04-08

## 2020-12-04 NOTE — Patient Instructions (Addendum)
If you are age 56 or older, your body mass index should be between 23-30. Your Body mass index is 33.53 kg/m. If this is out of the aforementioned range listed, please consider follow up with your Primary Care Provider.  If you are age 69 or younger, your body mass index should be between 19-25. Your Body mass index is 33.53 kg/m. If this is out of the aformentioned range listed, please consider follow up with your Primary Care Provider.   Continue Pantoprazole for 1 more month then stop. We have sent in extra refills to your pharmacy.  Avoid Asprin and NSAIDs  Follow up with Dr. Hilarie Fredrickson as needed.  Thank you for entrusting me with your care and choosing [facility].  Amy Esterwood, PA-C

## 2020-12-04 NOTE — Progress Notes (Signed)
Subjective:    Patient ID: Harold Myers, male    DOB: 1966/07/31, 55 y.o.   MRN: [id number]  HPI Harold Myers is a pleasant 55 year old male, established with Dr. Hilarie Fredrickson who comes in today for follow-up after recent evaluation for complaints of abdominal pain and rectal pain.  There had been some initial concern for possible ventral hernia and he has since been evaluated by Dr. Rinaldo Cloud and had CT of the abdomen and pelvis done which did not show any evidence of abdominal wall hernia. Patient then underwent colonoscopy and EGD 11/09/2020.  Colonoscopy with finding of one 5 mm polyp in the transverse colon which was a tubular adenoma and multiple diverticuli in the sigmoid colon.  There was no evidence of any anal or rectal abnormality. EGD pertinent for moderate gastritis normal-appearing esophagus.  Biopsies of the stomach showed reactive gastropathy and no H. pylori. Patient has been on a course of Protonix 20 mg p.o. every morning. He says he is feeling much better at this point.  He points to his left mid abdomen and flank as the area of previous pain and says this has resolved.  He only had pain for couple of days total but said it was significant when it was there. He is not using any regular aspirin or NSAIDs.,  No EtOH.  He has been trying to eat much more healthily and smaller meals and feels that that is helping without overloading his stomach.  He said he had been drinking a combination of lemon and garlic to help his lipids prior to having EGD and wonders if that may have inflamed his stomach. Bowel movements have been regular.  Review of Systems Pertinent positive and negative review of systems were noted in the above HPI section.  All other review of systems was otherwise negative.  Outpatient Encounter Medications as of 12/04/2020  Medication Sig  . acetaminophen (TYLENOL) 500 MG tablet Take 500 mg by mouth every 6 (six) hours as needed.  Marland Kitchen aspirin 81 MG chewable tablet Chew 81 mg  by mouth daily.  . bisacodyl (DULCOLAX) 5 MG EC tablet Take 5 mg by mouth daily as needed for moderate constipation.  . Colchicine 0.6 MG CAPS Take by mouth 2 (two) times daily.  . Digestive Enzymes (ENZYME DIGEST PO) Take by mouth 2 (two) times daily.  . ergocalciferol (VITAMIN D2) 50000 units capsule Take 50,000 Units by mouth once a week.  . febuxostat (ULORIC) 40 MG tablet Take 40 mg by mouth daily.  . fluticasone (FLONASE) 50 MCG/ACT nasal spray Place 2 sprays into both nostrils daily.  Marland Kitchen gabapentin (NEURONTIN) 300 MG capsule Take 300 mg by mouth 3 (three) times daily as needed.  . Lactobacillus-Inulin (CULTURELLE DIGESTIVE DAILY PO) Take by mouth.  . loratadine (CLARITIN) 10 MG tablet Take 10 mg by mouth daily. Reported on 09/23/2015  . losartan (COZAAR) 50 MG tablet Take 50 mg by mouth daily.  . sildenafil (VIAGRA) 100 MG tablet Take 100 mg by mouth daily as needed for erectile dysfunction.  . simvastatin (ZOCOR) 20 MG tablet Take 20 mg by mouth daily at 6 PM.  . [DISCONTINUED] pantoprazole (PROTONIX) 20 MG tablet Take 1 tablet (20 mg total) by mouth daily.  . pantoprazole (PROTONIX) 20 MG tablet Take 1 tablet (20 mg total) by mouth daily.  . [DISCONTINUED] famotidine (PEPCID) 20 MG tablet Take 20 mg by mouth daily.   No facility-administered encounter medications on file as of 12/04/2020.   No Known Allergies There are  no problems to display for this patient.  Social History   Socioeconomic History  . Marital status: Married    Spouse name: Not on file  . Number of children: Not on file  . Years of education: Not on file  . Highest education level: Not on file  Occupational History  . Not on file  Tobacco Use  . Smoking status: Never Smoker  . Smokeless tobacco: Never Used  Substance and Sexual Activity  . Alcohol use: Never    Comment: occasional beer or wine  . Drug use: No  . Sexual activity: Not on file  Other Topics Concern  . Not on file  Social History Narrative   . Not on file   Social Determinants of Health   Financial Resource Strain: Not on file  Food Insecurity: Not on file  Transportation Needs: Not on file  Physical Activity: Not on file  Stress: Not on file  Social Connections: Not on file  Intimate Partner Violence: Not on file    Mr. Slauson's family history includes Alcohol abuse in his brother; Arthritis in his brother, father, mother, and sister; Breast cancer in his mother and sister; Diabetes in his brother and mother; Heart disease in his brother and mother; Prostate cancer in his brother.      Objective:    Vitals:   12/04/20 0852  BP: 126/80  Pulse: 72    Physical Exam Well-developed well-nourished older African-American male in no acute distress..  Pleasant height, Weight, 230 BMI 33.5  HEENT; nontraumatic normocephalic, EOMI, PE R LA, sclera anicteric. Oropharynx; not examined today Neck; supple, no JVD Cardiovascular; regular rate and rhythm with S1-S2, no murmur rub or gallop Pulmonary; Clear bilaterally Abdomen; soft, nontender, nondistended, no palpable mass or hepatosplenomegaly, bowel sounds are active Rectal; not done Skin; benign exam, no jaundice rash or appreciable lesions Extremities; no clubbing cyanosis or edema skin warm and dry Neuro/Psych; alert and oriented x4, grossly nonfocal mood and affect appropriate       Assessment & Plan:   #50 55 year old male with recent episode of abdominal pain and rectal pain.  Symptoms resolved  Colonoscopy 11/09/2020 with no evidence of any anal or rectal abnormality  EGD 11/09/2020 with moderate gastritis,/reactive gastropathy by path H. pylori negative  #2 adenomatous colon polyp 5 mm-patient indicated for 7-year interval follow-up  #3 hypertension 4.  Hyperlipidemia 5.  Sleep apnea  Plan; Will continue Protonix 20 mg p.o. every morning AC breakfast for 1 more month then may discontinue Avoid NSAIDs Patient had several questions about diet, questions  answered Patient will follow-up with Dr. Hilarie Fredrickson or myself on a as needed basis.   We will plan follow-up colonoscopy 2029,  Alfredia Ferguson PA-C 12/04/2020   Cc: Nolene Ebbs, MD

## 2020-12-07 NOTE — Progress Notes (Signed)
Addendum: Reviewed and agree with assessment and management plan. Tyah Acord M, MD  

## 2021-02-11 ENCOUNTER — Ambulatory Visit: Payer: Medicare Other | Admitting: Internal Medicine

## 2021-03-06 ENCOUNTER — Other Ambulatory Visit: Payer: Self-pay | Admitting: Physician Assistant

## 2021-04-08 ENCOUNTER — Other Ambulatory Visit: Payer: Self-pay | Admitting: Physician Assistant

## 2021-07-22 IMAGING — CT CT ABD-PELV W/O CM
1 of 2 series · 15 of 32 positions shown, 19 images · non-contrast
Comparison: None.

CLINICAL DATA: 54-year-old male with epigastric pain.

EXAM:
CT ABDOMEN AND PELVIS WITHOUT CONTRAST
TECHNIQUE: Multidetector CT imaging of the abdomen and pelvis was performed
following the standard protocol without IV contrast.

[Series 2: abd/pelvis w/(date) · axial · 0.85mm/px · z∈[-501,-41]mm · 15 of 102 slices shown, 19 images]
[im 5/102  soft-tissue]
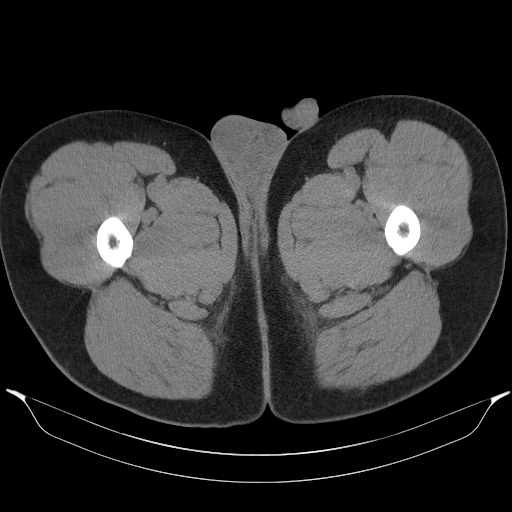
[im 5/102  bone]
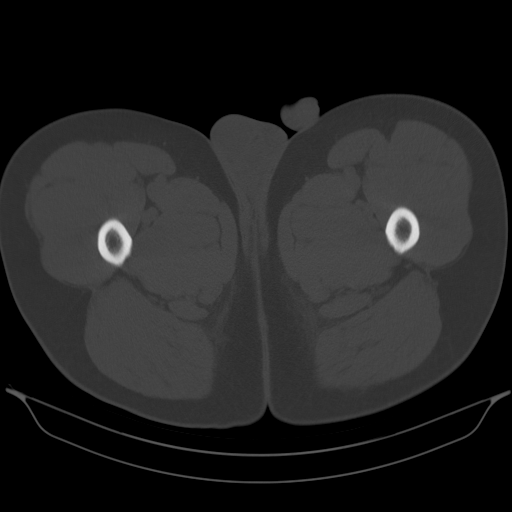
[im 14/102  soft-tissue]
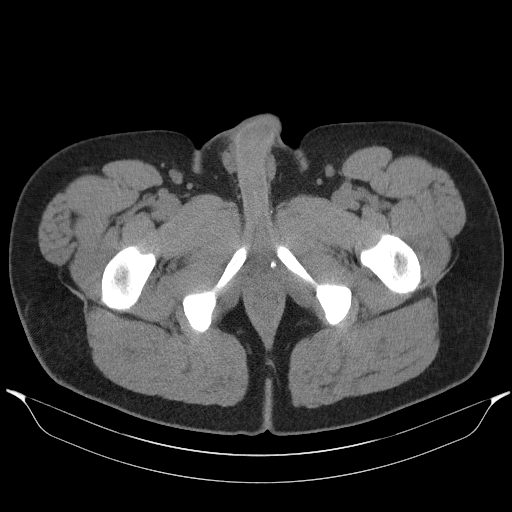
[im 23/102  soft-tissue]
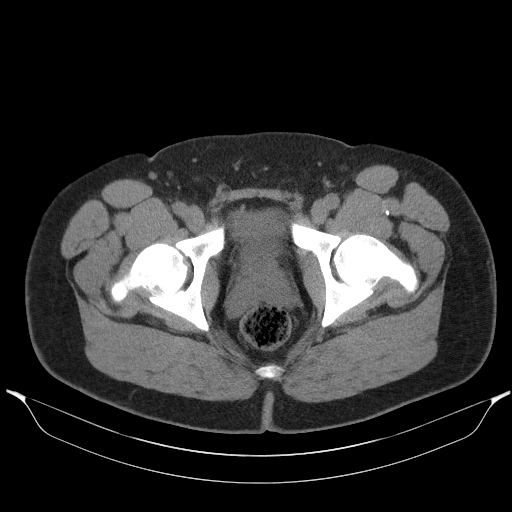
[im 28/102  soft-tissue]
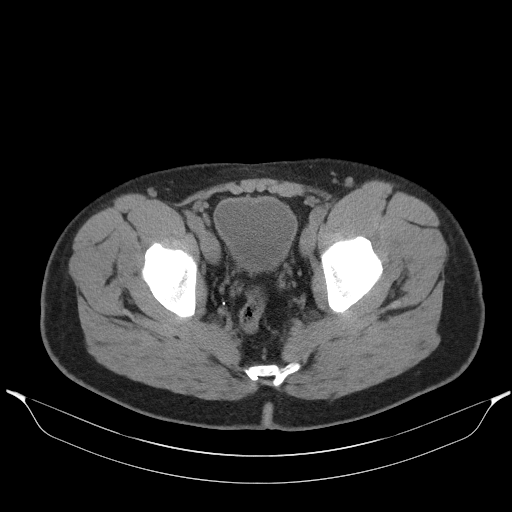
[im 37/102  soft-tissue]
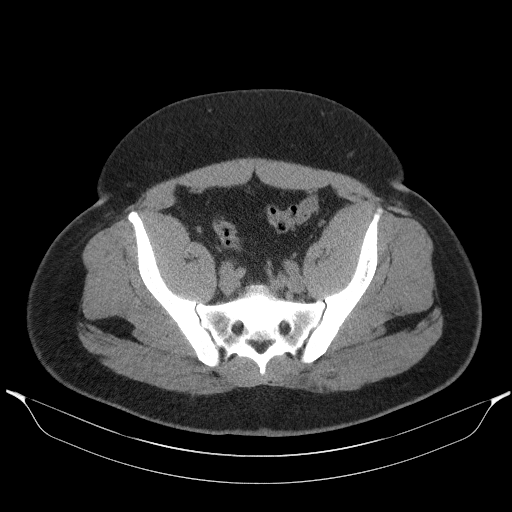
[im 42/102  soft-tissue]
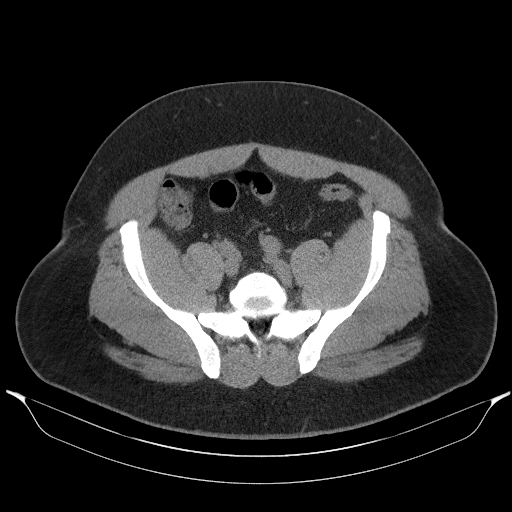
[im 51/102  soft-tissue]
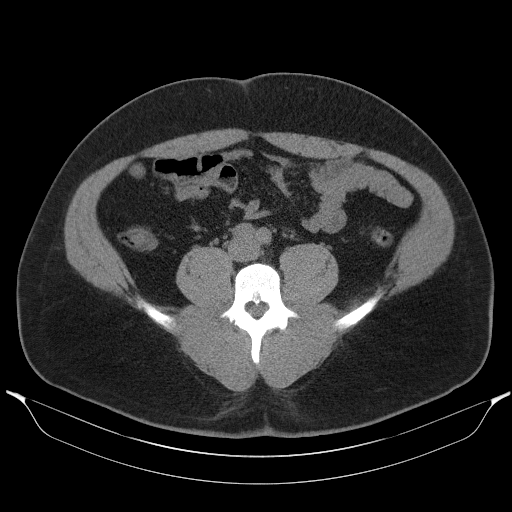
[im 60/102  soft-tissue]
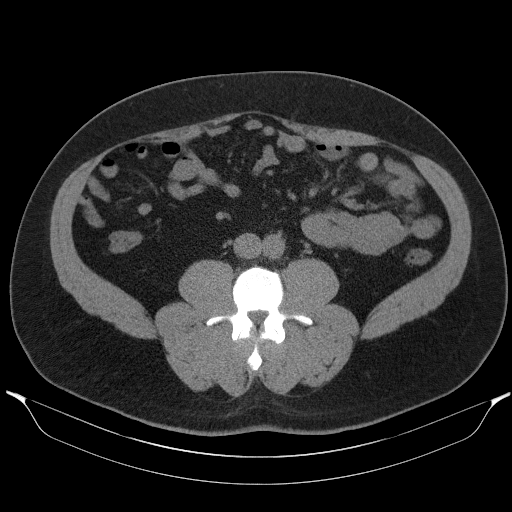
[im 65/102  soft-tissue]
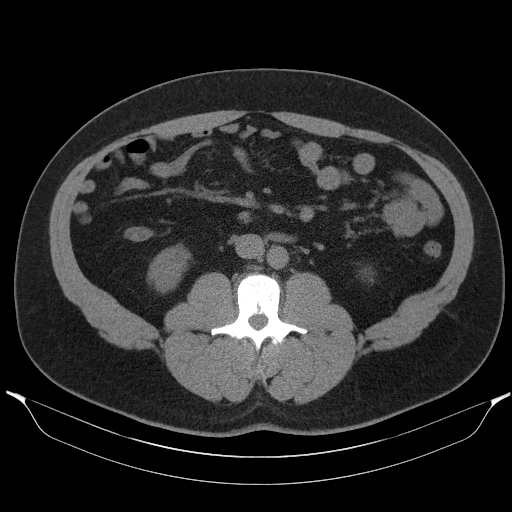
[im 65/102  bone]
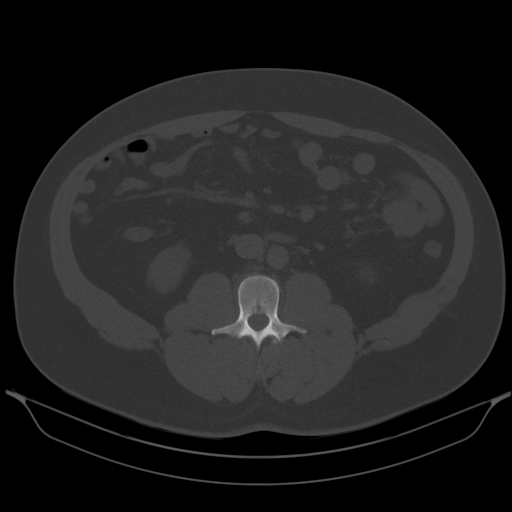
[im 74/102  soft-tissue]
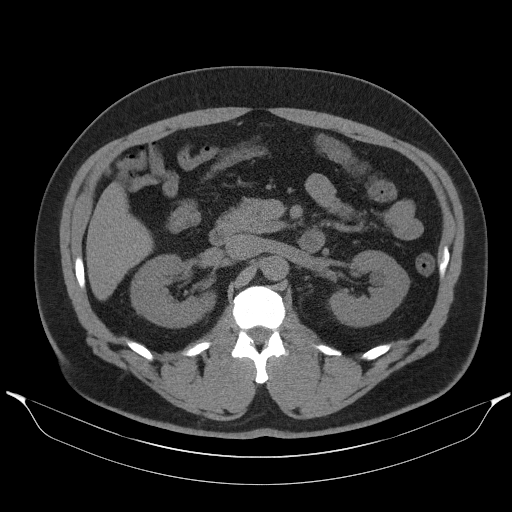
[im 79/102  soft-tissue]
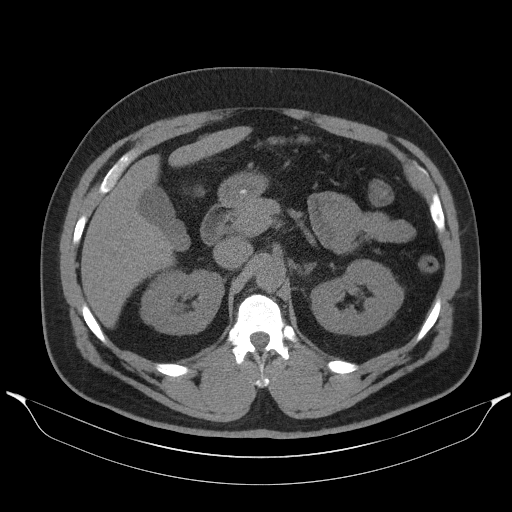
[im 83/102  lung]
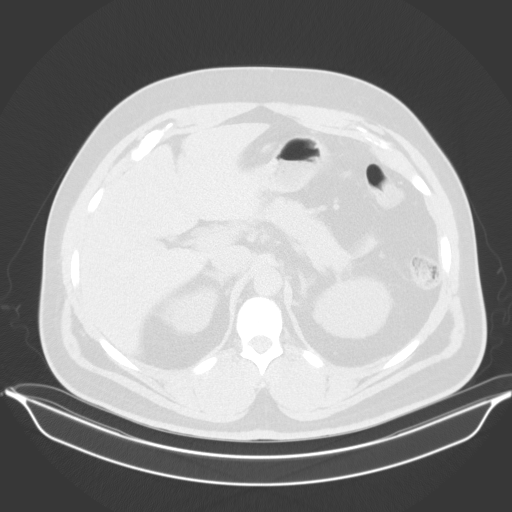
[im 88/102  soft-tissue]
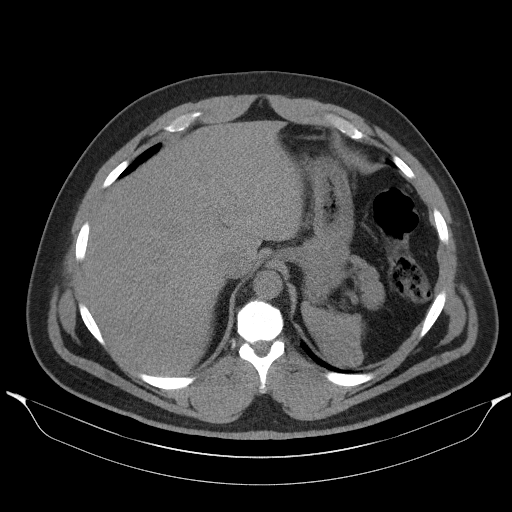
[im 88/102  lung]
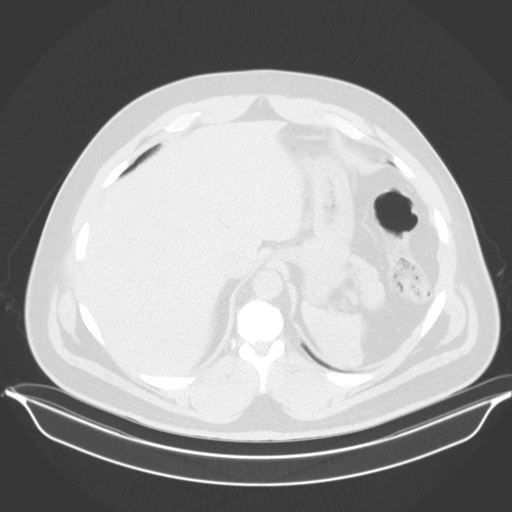
[im 92/102  lung]
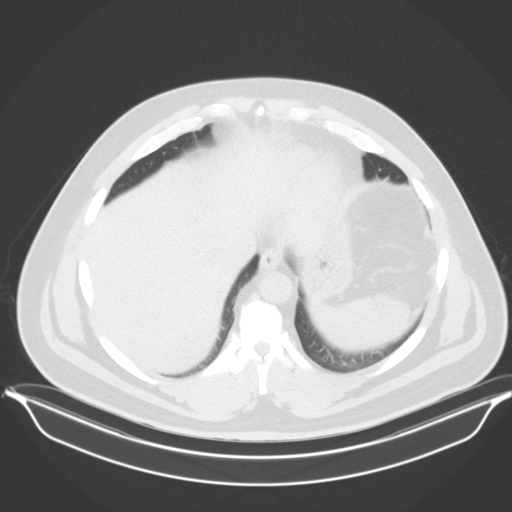
[im 97/102  soft-tissue]
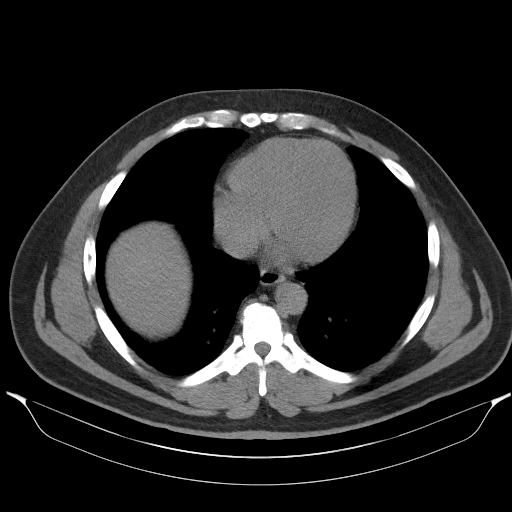
[im 97/102  lung]
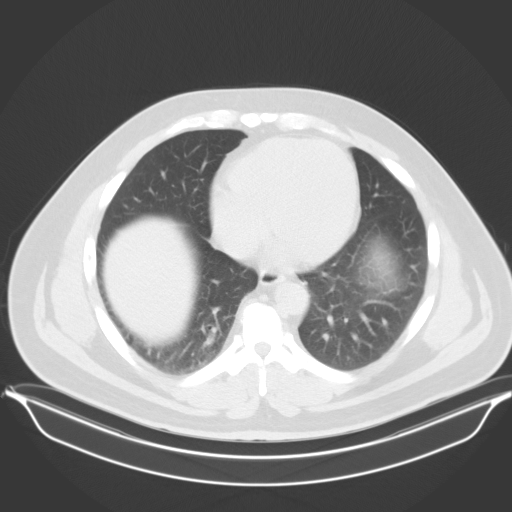

[15 of 32 positions shown; findings below may reference images not displayed]

FINDINGS: Evaluation of this exam is limited in the absence of intravenous
contrast.

Lower chest: The visualized lung bases are clear.

No intra-abdominal free air or free fluid.

Hepatobiliary: No focal liver abnormality is seen. No gallstones,
gallbladder wall thickening, or biliary dilatation.

Pancreas: Unremarkable. No pancreatic ductal dilatation or
surrounding inflammatory changes.

Spleen: Normal in size without focal abnormality.

Adrenals/Urinary Tract: The adrenal glands unremarkable. There is no
hydronephrosis or obstructing stone on either side. Punctate
nonobstructing left renal inferior pole calculus may be present.
Several small left renal hypodense lesions are not characterized on
this CT, possibly cysts. Ultrasound may provide better evaluation.
The visualized ureter and urinary bladder appear unremarkable.

Stomach/Bowel: Several small scattered sigmoid diverticula without
active inflammatory changes. There is no bowel obstruction or active
inflammation. Diffuse thickened appearance of the colon, likely
related to underdistention. The appendix is normal.

Vascular/Lymphatic: The abdominal aorta and IVC are grossly
unremarkable on this noncontrast CT. No portal venous gas. There is
no adenopathy.

Reproductive: The prostate and seminal vesicles are grossly
unremarkable. No pelvic mass.

Other: None

Musculoskeletal: No acute or significant osseous findings.
IMPRESSION: 1. No acute intra-abdominal or pelvic pathology. No bowel
obstruction. Normal appendix.
2. Small scattered sigmoid diverticula.
3. No hydronephrosis or obstructing stone.

## 2021-12-30 ENCOUNTER — Ambulatory Visit (HOSPITAL_COMMUNITY)
Admission: EM | Admit: 2021-12-30 | Discharge: 2021-12-30 | Disposition: A | Discharge status: Home / Self Care | Payer: Medicare Other | Attending: Physician Assistant | Admitting: Physician Assistant

## 2021-12-30 ENCOUNTER — Ambulatory Visit (INDEPENDENT_AMBULATORY_CARE_PROVIDER_SITE_OTHER): Payer: Medicare Other

## 2021-12-30 ENCOUNTER — Encounter (HOSPITAL_COMMUNITY): Payer: Self-pay

## 2021-12-30 DIAGNOSIS — S90112A Contusion of left great toe without damage to nail, initial encounter: Secondary | ICD-10-CM

## 2021-12-30 DIAGNOSIS — M79675 Pain in left toe(s): Secondary | ICD-10-CM | POA: Diagnosis not present

## 2021-12-30 DIAGNOSIS — M7989 Other specified soft tissue disorders: Secondary | ICD-10-CM

## 2021-12-30 NOTE — ED Triage Notes (Signed)
Pt presents with left great toe injury after a piece of wood fell on it yesterday.

## 2021-12-30 NOTE — Discharge Instructions (Addendum)
Advised to continue using ice as needed to help reduce pain.

## 2021-12-30 NOTE — ED Provider Notes (Signed)
[facility]    CSN: [id number] Arrival date & time: 12/30/21  1512      History   Chief Complaint Chief Complaint  Patient presents with   Toe Injury    HPI Harold Myers is a 56 y.o. male.   56 year old male presents with left great toe pain.  Patient relates that yesterday he was putting some wood up in the attic when he threw 2 x 4 in the attic but it came back down and struck his left big toe.  Since he has been having some pain and discomfort in the left big toe mild pain when he walks.  Patient indicates he does have some bruising of the left big toe and coloration of the nailbed.  Patient indicates he has received relief from the pain when taking ibuprofen and using cool compresses.  No weakness, numbness or tingling.    Past Medical History:  Diagnosis Date   Allergy    Arthritis    Diverticulosis    Hyperlipidemia    Hypertension    Sleep apnea    wears CPAP   Tubular adenoma of colon     There are no problems to display for this patient.   Past Surgical History:  Procedure Laterality Date   SHOULDER SURGERY Left    rotator cuff, bone spur- left shoulder       Home Medications    Prior to Admission medications   Medication Sig Start Date End Date Taking? Authorizing Provider  acetaminophen (TYLENOL) 500 MG tablet Take 500 mg by mouth every 6 (six) hours as needed.    [provider]  aspirin 81 MG chewable tablet Chew 81 mg by mouth daily.    [provider]  bisacodyl (DULCOLAX) 5 MG EC tablet Take 5 mg by mouth daily as needed for moderate constipation.    [provider]  Colchicine 0.6 MG CAPS Take by mouth 2 (two) times daily.    [provider]  Digestive Enzymes (ENZYME DIGEST PO) Take by mouth 2 (two) times daily.    [provider]  ergocalciferol (VITAMIN D2) 50000 units capsule Take 50,000 Units by mouth once a week.    [provider]  febuxostat (ULORIC) 40 MG tablet  Take 40 mg by mouth daily.    [provider]  fluticasone (FLONASE) 50 MCG/ACT nasal spray Place 2 sprays into both nostrils daily.    [provider]  gabapentin (NEURONTIN) 300 MG capsule Take 300 mg by mouth 3 (three) times daily as needed.    [provider]  Lactobacillus-Inulin (CULTURELLE DIGESTIVE DAILY PO) Take by mouth.    [provider]  loratadine (CLARITIN) 10 MG tablet Take 10 mg by mouth daily. Reported on 09/23/2015    [provider]  losartan (COZAAR) 50 MG tablet Take 50 mg by mouth daily. 07/22/20   [provider]  pantoprazole (PROTONIX) 20 MG tablet TAKE 1 TABLET BY MOUTH EVERY DAY 04/08/21   Esterwood, Amy S, PA-C  sildenafil (VIAGRA) 100 MG tablet Take 100 mg by mouth daily as needed for erectile dysfunction.    [provider]  simvastatin (ZOCOR) 20 MG tablet Take 20 mg by mouth daily at 6 PM.    [provider]  famotidine (PEPCID) 20 MG tablet Take 20 mg by mouth daily.  10/16/20  [provider]    Family History Family History  Problem Relation Age of Onset   Arthritis Mother    Breast  cancer Mother    Diabetes Mother    Heart disease Mother    Arthritis Father    Arthritis Sister    Breast cancer Sister    Alcohol abuse Brother    Arthritis Brother    Diabetes Brother    Heart disease Brother    Prostate cancer Brother    Colon cancer Neg Hx    Esophageal cancer Neg Hx    Rectal cancer Neg Hx    Stomach cancer Neg Hx     Social History Social History   Tobacco Use   Smoking status: Never   Smokeless tobacco: Never  Substance Use Topics   Alcohol use: Never    Comment: occasional beer or wine   Drug use: No     Allergies   Patient has no known allergies.   Review of Systems Review of Systems  Musculoskeletal:  Positive for joint swelling (left big toe).    Physical Exam Triage Vital Signs ED Triage Vitals  Enc Vitals Group     BP 12/30/21 1637 128/83      Pulse Rate 12/30/21 1636 66     Resp 12/30/21 1636 18     Temp 12/30/21 1636 98 F (36.7 C)     Temp Source 12/30/21 1636 Oral     SpO2 12/30/21 1636 97 %     Weight --      Height --      Head Circumference --      Peak Flow --      Pain Score 12/30/21 1636 5     Pain Loc --      Pain Edu? --      Excl. in [place]? --    No data found.  Updated Vital Signs BP 128/83   Pulse 66   Temp 98 F (36.7 C) (Oral)   Resp 18   SpO2 97%   Visual Acuity Right Eye Distance:   Left Eye Distance:   Bilateral Distance:    Right Eye Near:   Left Eye Near:    Bilateral Near:     Physical Exam Constitutional:      Appearance: Normal appearance.  Feet:     Comments: Left foot: Big toe with mild bruising along the nailbed.  There is minimal swelling.  And there is limited range of motion with flexion.  No crepitus. Neurological:     Mental Status: He is alert.     UC Treatments / Results  Labs (all labs ordered are listed, but only abnormal results are displayed) Labs Reviewed - No data to display  EKG   Radiology DG Toe Great Left  Result Date: 12/30/2021 CLINICAL DATA:  Board fell onto the left toe at distal phalanx. EXAM: LEFT GREAT TOE COMPARISON:  None Available. FINDINGS: Mildly displaced transverse fracture through the distal aspect of the distal phalanx of the first digit. No other appreciable fracture. Soft tissue swelling about the distal phalanx of the first digit. IMPRESSION: Mildly displaced fracture of the distal phalanx of the first digit. Electronically Signed   By: Keane Police D.O.   On: 12/30/2021 17:20    Procedures Procedures (including critical care time)  Medications Ordered in UC Medications - No data to display  Initial Impression / Assessment and Plan / UC Course  I have reviewed the triage vital signs and the nursing notes.  Pertinent labs & imaging results that were available during my care of the patient were reviewed by me and considered in  my medical decision making (see chart for details).    Plan: Advised to continue ice to area frequently. Advised to take ibuprofen as needed for pain. Follow-up with PCP or return to urgent care if symptoms fail to improve over the next week to 10 days. Advised to use a padded shoe for the next 4 weeks. Final Clinical Impressions(s) / UC Diagnoses   Final diagnoses:  Contusion of left great toe without damage to nail, initial encounter  Pain and swelling of toe of left foot     Discharge Instructions      Advised to continue using ice as needed to help reduce pain.     ED Prescriptions   None    PDMP not reviewed this encounter.   Nyoka Lint, PA-C 12/30/21 1724

## 2022-02-03 ENCOUNTER — Encounter (HOSPITAL_COMMUNITY): Payer: Self-pay | Admitting: *Deleted

## 2022-02-03 ENCOUNTER — Ambulatory Visit (HOSPITAL_COMMUNITY)
Admission: EM | Admit: 2022-02-03 | Discharge: 2022-02-03 | Disposition: A | Discharge status: Home / Self Care | Payer: Medicare Other

## 2022-02-03 DIAGNOSIS — J069 Acute upper respiratory infection, unspecified: Secondary | ICD-10-CM | POA: Diagnosis not present

## 2022-02-03 HISTORY — DX: Other cervical disc displacement, unspecified cervical region: M50.20

## 2022-02-03 HISTORY — DX: Gout, unspecified: M10.9

## 2022-02-03 MED ORDER — BENZONATATE 100 MG PO CAPS: 100.0000 mg | ORAL_CAPSULE | Freq: Four times a day (QID) | ORAL | 0 refills | Status: AC | PRN

## 2022-02-03 NOTE — ED Provider Notes (Signed)
Harold Myers    CSN: [id number] Arrival date & time: 02/03/22  1340      History   Chief Complaint Chief Complaint  Patient presents with   Nasal Congestion   Cough    HPI Harold Myers is a 56 y.o. male.   Patient complains of a cough and congestion for the past 4 days patient reports he has not had any fever he has not had any chills patient reports he has had some discolored phlegm.  Patient reports slight sore throat patient reports his wife had a similar infection last week  The history is provided by the patient. No language interpreter was used.  Cough Cough characteristics:  Non-productive   Past Medical History:  Diagnosis Date   Allergy    Arthritis    Diverticulosis    Gout    Herniated cervical disc    Hyperlipidemia    Hypertension    Sleep apnea    wears CPAP   Tubular adenoma of colon     There are no problems to display for this patient.   Past Surgical History:  Procedure Laterality Date   SHOULDER SURGERY Left    rotator cuff, bone spur- left shoulder       Home Medications    Prior to Admission medications   Medication Sig Start Date End Date Taking? Authorizing Provider  acetaminophen (TYLENOL) 500 MG tablet Take 500 mg by mouth every 6 (six) hours as needed.   Yes [provider]  aspirin 81 MG chewable tablet Chew 81 mg by mouth daily.   Yes [provider]  benzonatate (TESSALON PERLES) 100 MG capsule Take 1 capsule (100 mg total) by mouth every 6 (six) hours as needed for cough. 02/03/22 02/03/23 Yes Fransico Meadow, PA-C  Chlorphen-PE-Acetaminophen (NOREL AD PO) Take by mouth.   Yes [provider]  Colchicine 0.6 MG CAPS Take by mouth 2 (two) times daily.   Yes [provider]  ergocalciferol (VITAMIN D2) 50000 units capsule Take 50,000 Units by mouth once a week.   Yes [provider]  febuxostat (ULORIC) 40 MG tablet Take 40 mg by mouth daily.   Yes [provider]  fluticasone (FLONASE) 50 MCG/ACT nasal spray Place 2 sprays into both nostrils daily.   Yes [provider]  gabapentin (NEURONTIN) 300 MG capsule Take 300 mg by mouth 3 (three) times daily as needed.   Yes [provider]  Lactobacillus-Inulin (CULTURELLE DIGESTIVE DAILY PO) Take by mouth.   Yes [provider]  loratadine (CLARITIN) 10 MG tablet Take 10 mg by mouth daily. Reported on 09/23/2015   Yes [provider]  losartan (COZAAR) 50 MG tablet Take 50 mg by mouth daily. 07/22/20  Yes [provider]  pantoprazole (PROTONIX) 20 MG tablet TAKE 1 TABLET BY MOUTH EVERY DAY 04/08/21  Yes Esterwood, Amy S, PA-C  simvastatin (ZOCOR) 20 MG tablet Take 20 mg by mouth daily at 6 PM.   Yes [provider]  bisacodyl (DULCOLAX) 5 MG EC tablet Take 5 mg by mouth daily as needed for moderate constipation.    [provider]  Digestive Enzymes (ENZYME DIGEST PO) Take by mouth 2 (two) times daily.    [provider]  sildenafil (VIAGRA) 100 MG tablet Take 100 mg by mouth daily as needed for erectile dysfunction.    [provider]  famotidine (PEPCID) 20 MG tablet Take 20 mg by mouth daily.  10/16/20  [provider]    Family History Family History  Problem Relation Age of Onset   Arthritis Mother    Breast cancer Mother    Diabetes Mother    Heart disease Mother    Arthritis Father    Arthritis Sister    Breast cancer Sister    Alcohol abuse Brother    Arthritis Brother    Diabetes Brother    Heart disease Brother    Prostate cancer Brother    Colon cancer Neg Hx    Esophageal cancer Neg Hx    Rectal cancer Neg Hx    Stomach cancer Neg Hx     Social History Social History   Tobacco Use   Smoking status: Never   Smokeless tobacco: Never  Vaping Use   Vaping Use: Never used  Substance Use Topics   Alcohol use: Not Currently   Drug use: Never     Allergies   Patient has no known  allergies.   Review of Systems Review of Systems  Respiratory:  Positive for cough.   All other systems reviewed and are negative.    Physical Exam Triage Vital Signs ED Triage Vitals  Enc Vitals Group     BP 02/03/22 1357 (!) 137/94     Pulse Rate 02/03/22 1357 66     Resp 02/03/22 1357 18     Temp 02/03/22 1357 99.3 F (37.4 C)     Temp Source 02/03/22 1357 Oral     SpO2 02/03/22 1357 95 %     Weight --      Height --      Head Circumference --      Peak Flow --      Pain Score 02/03/22 1359 3     Pain Loc --      Pain Edu? --      Excl. in [place]? --    No data found.  Updated Vital Signs BP (!) 137/94 Comment: has been taking decongestant - most recently 1 hr ago  Pulse 66   Temp 99.3 F (37.4 C) (Oral)   Resp 18   SpO2 95%   Visual Acuity Right Eye Distance:   Left Eye Distance:   Bilateral Distance:    Right Eye Near:   Left Eye Near:    Bilateral Near:     Physical Exam Vitals and nursing note reviewed.  Constitutional:      Appearance: He is well-developed.  HENT:     Head: Normocephalic.  Eyes:     Extraocular Movements: Extraocular movements intact.     Pupils: Pupils are equal, round, and reactive to light.  Pulmonary:     Effort: Pulmonary effort is normal.  Abdominal:     General: There is no distension.  Musculoskeletal:        General: Normal range of motion.     Cervical back: Normal range of motion.  Neurological:     Mental Status: He is alert and oriented to person, place, and time.      UC Treatments / Results  Labs (all labs ordered are listed, but only abnormal results are displayed) Labs Reviewed - No data to display  EKG   Radiology No results found.  Procedures Procedures (including critical care time)  Medications Ordered in UC Medications - No data to display  Initial Impression / Assessment and Plan / UC Course  I have reviewed the triage vital signs and the nursing notes.  Pertinent labs & imaging  results that  were available during my care of the patient were reviewed by me and considered in my medical decision making (see chart for details).    Patient symptoms most consistent with viral illness he is advised to continue Tylenol and over-the-counter decongestant.  Patient is given a prescription for Tessalon to help with cough  Final Clinical Impressions(s) / UC Diagnoses   Final diagnoses:  Upper respiratory tract infection, unspecified type   Discharge Instructions   None    ED Prescriptions     Medication Sig Dispense Auth. Provider   benzonatate (TESSALON PERLES) 100 MG capsule Take 1 capsule (100 mg total) by mouth every 6 (six) hours as needed for cough. 30 capsule Fransico Meadow, [place]      PDMP not reviewed this encounter.   Fransico Meadow, [place] 02/03/22 1912

## 2022-02-03 NOTE — ED Triage Notes (Signed)
C/O "cold sxs" x 3-4 days, including nasal congestion, cough, runny nose, HA; c/o "hot flashes", unsure if fevers.. Has been taking Norel AD and Theraflu.

## 2022-11-16 ENCOUNTER — Other Ambulatory Visit: Payer: Self-pay | Admitting: Internal Medicine

## 2022-11-17 LAB — COMPLETE METABOLIC PANEL WITH GFR
AG Ratio: 1.3 (calc) (ref 1.0–2.5)
ALT: 14 U/L (ref 9–46)
AST: 16 U/L (ref 10–35)
Albumin: 4.6 g/dL (ref 3.6–5.1)
Alkaline phosphatase (APISO): 60 U/L (ref 35–144)
BUN: 18 mg/dL (ref 7–25)
CO2: 18 mmol/L — ABNORMAL LOW (ref 20–32)
Calcium: 9.8 mg/dL (ref 8.6–10.3)
Chloride: 99 mmol/L (ref 98–110)
Creat: 1.2 mg/dL (ref 0.70–1.30)
Globulin: 3.6 g/dL (calc) (ref 1.9–3.7)
Glucose, Bld: 66 mg/dL (ref 65–99)
Potassium: 4.4 mmol/L (ref 3.5–5.3)
Sodium: 135 mmol/L (ref 135–146)
Total Bilirubin: 0.7 mg/dL (ref 0.2–1.2)
Total Protein: 8.2 g/dL — ABNORMAL HIGH (ref 6.1–8.1)
eGFR: 71 mL/min/{1.73_m2} (ref >=60)

## 2022-11-17 LAB — CBC
HCT: 50 % (ref 38.5–50.0)
Hemoglobin: 16.7 g/dL (ref 13.2–17.1)
MCH: 27.5 pg (ref 27.0–33.0)
MCHC: 33.4 g/dL (ref 32.0–36.0)
MCV: 82.4 fL (ref 80.0–100.0)
MPV: 11.5 fL (ref 7.5–12.5)
Platelets: 136 10*3/uL — ABNORMAL LOW (ref 140–400)
RBC: 6.07 10*6/uL — ABNORMAL HIGH (ref 4.20–5.80)
RDW: 15.2 % — ABNORMAL HIGH (ref 11.0–15.0)
WBC: 5 10*3/uL (ref 3.8–10.8)

## 2022-11-17 LAB — URIC ACID: Uric Acid, Serum: 9.5 mg/dL — ABNORMAL HIGH (ref 4.0–8.0)

## 2022-11-17 LAB — TSH: TSH: 3.33 mIU/L (ref 0.40–4.50)

## 2022-11-17 LAB — PSA: PSA: 2.65 ng/mL (ref <=4.00)

## 2022-11-17 LAB — VITAMIN D 25 HYDROXY (VIT D DEFICIENCY, FRACTURES): Vit D, 25-Hydroxy: 85 ng/mL (ref 30–100)

## 2023-02-11 IMAGING — DX DG TOE GREAT 2+V*L*
3 series · 3 of 3 positions shown · non-contrast
Comparison: None Available.

CLINICAL DATA: Board fell onto the left toe at distal phalanx.

EXAM:
LEFT GREAT TOE

[toe ap]
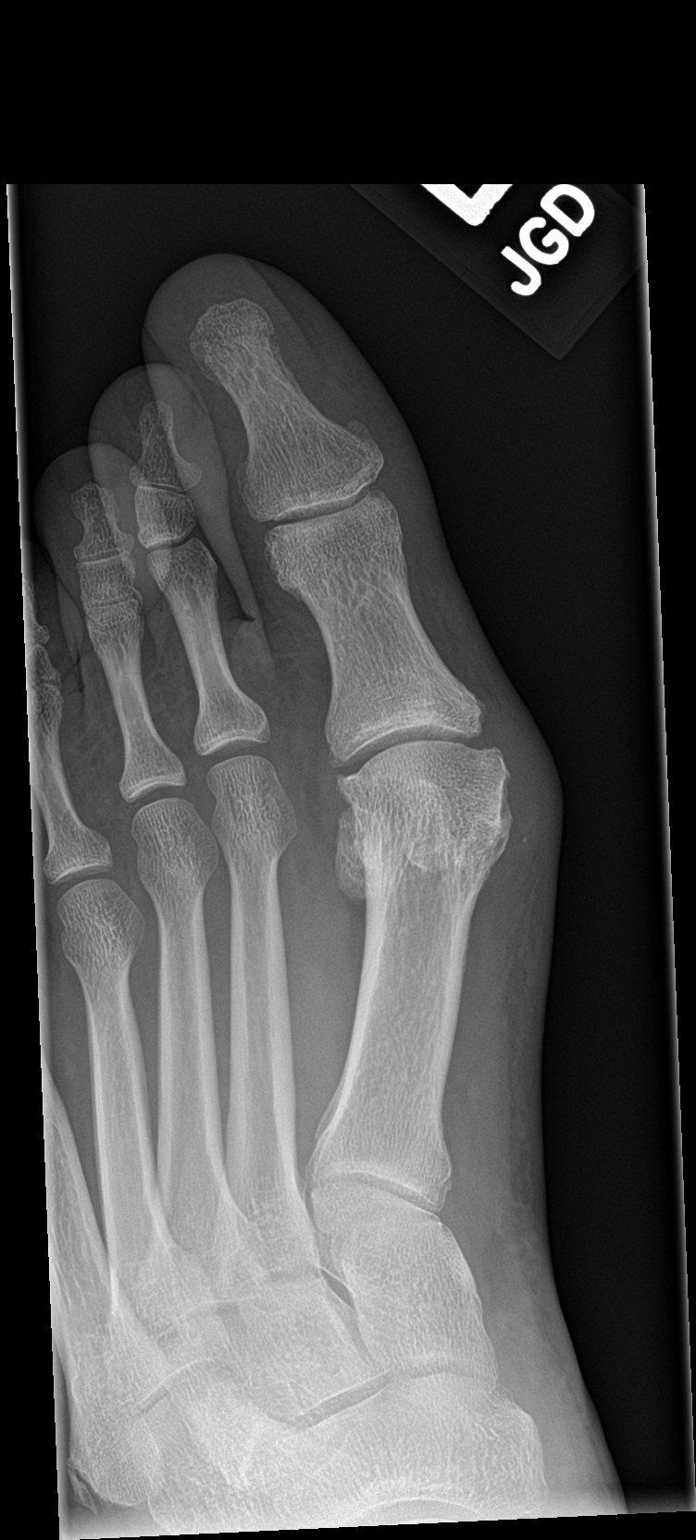

[toe obl]
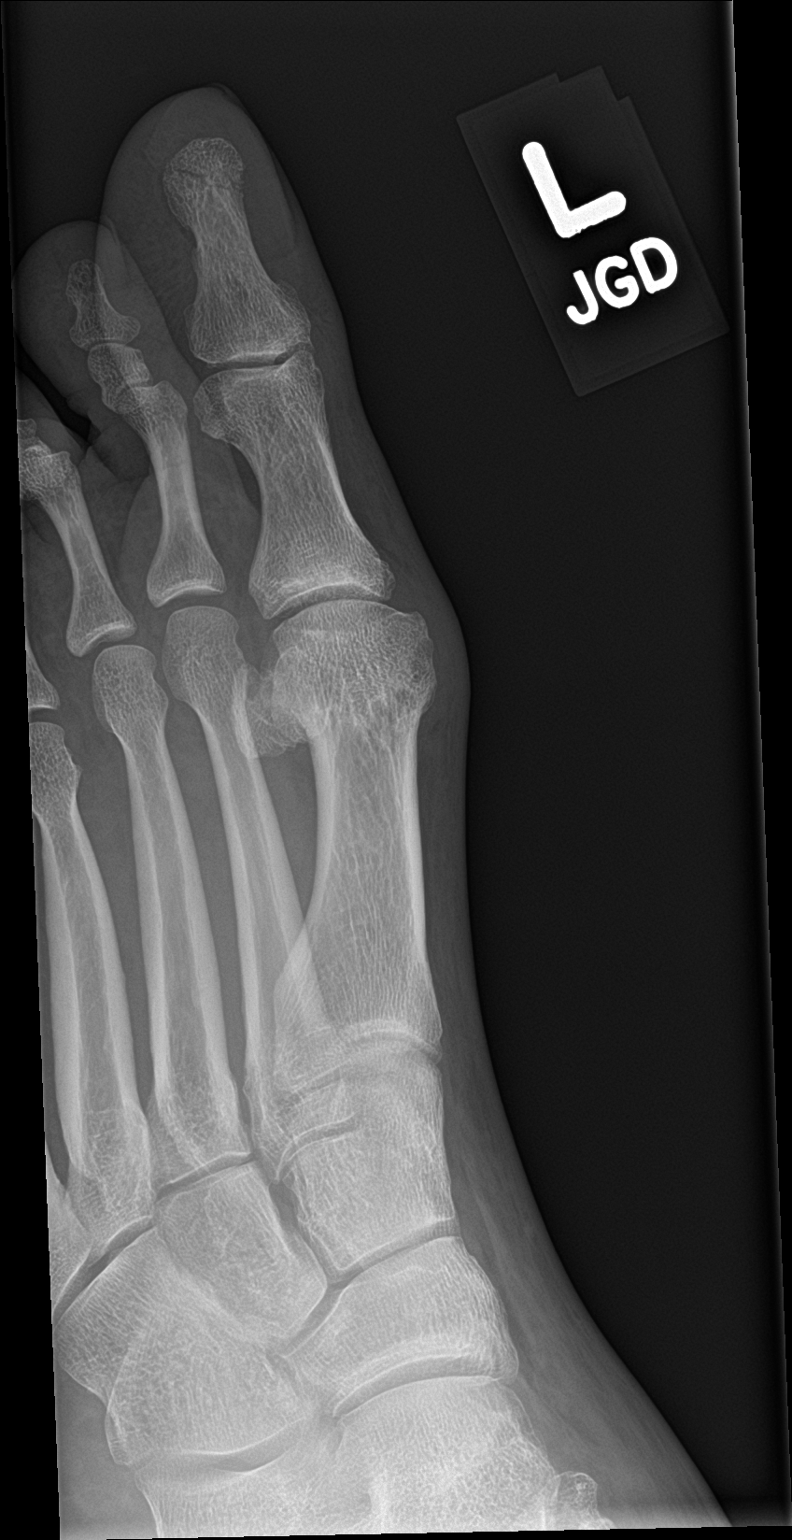

[toe lat]
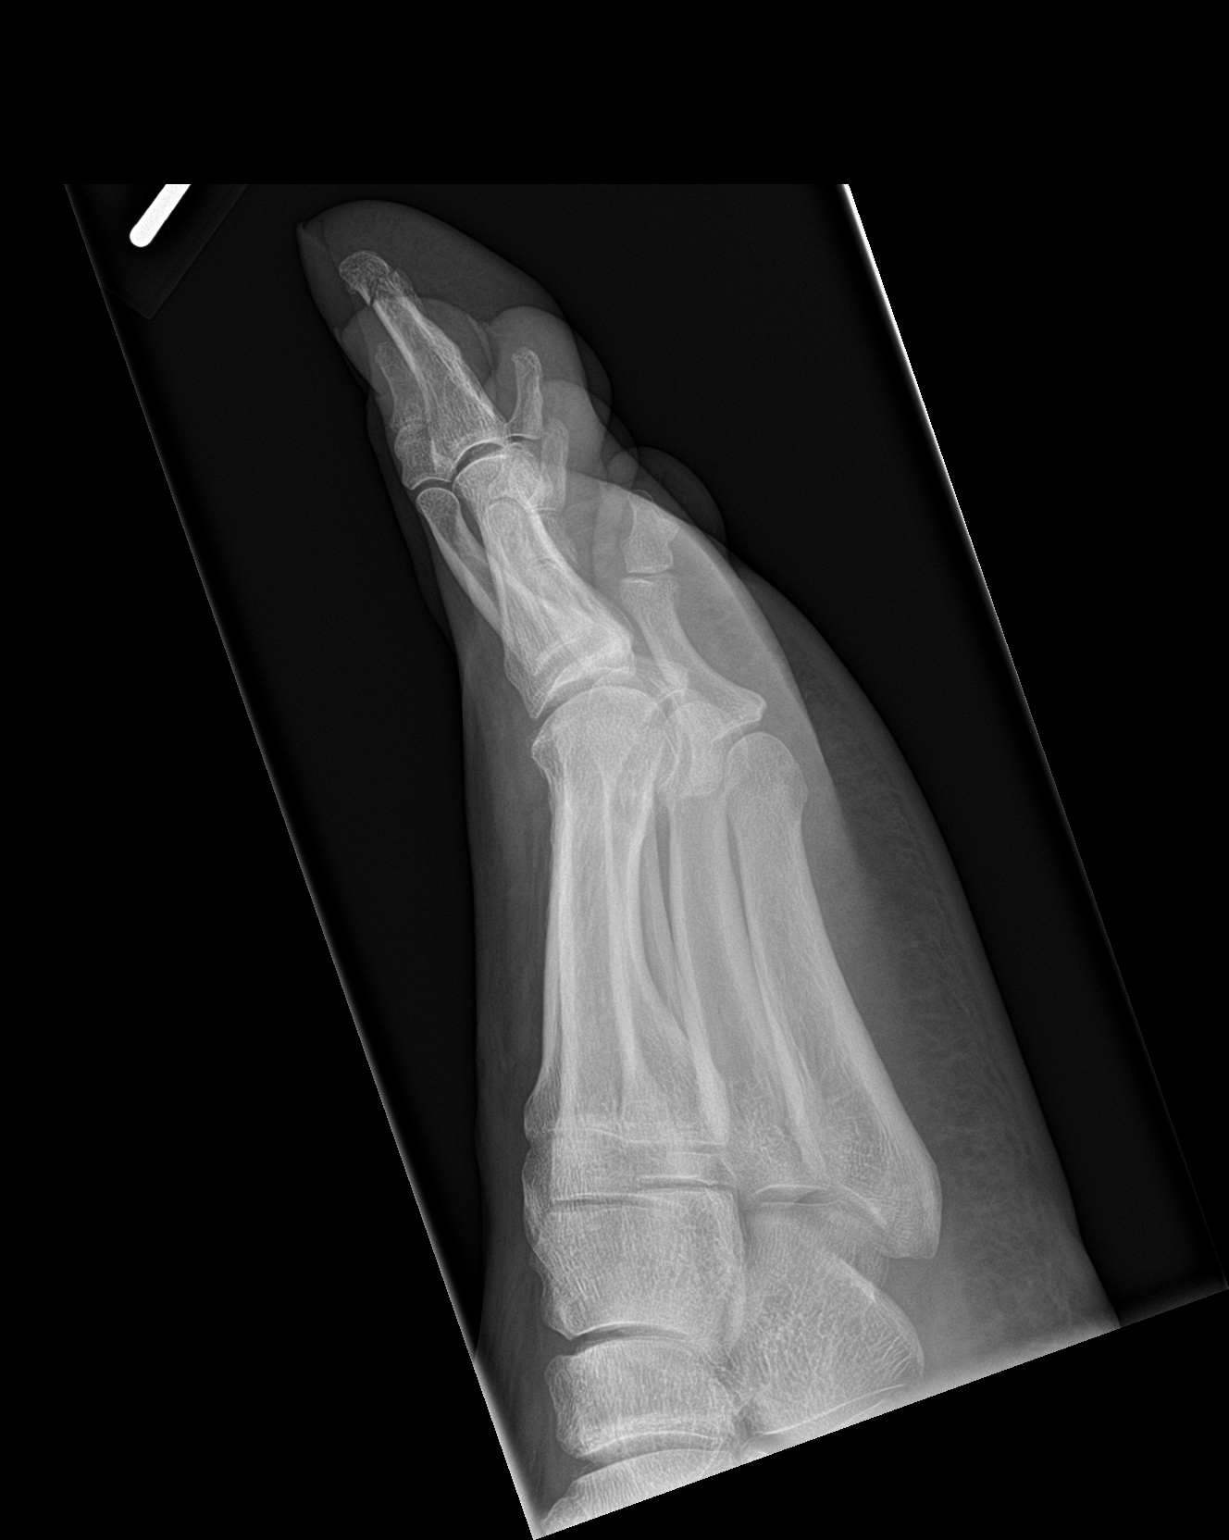

[3 of 3 positions shown; findings below may reference images not displayed]

FINDINGS: Mildly displaced transverse fracture through the distal aspect of
the distal phalanx of the first digit. No other appreciable
fracture. Soft tissue swelling about the distal phalanx of the first
digit.
IMPRESSION: Mildly displaced fracture of the distal phalanx of the first digit.

## 2023-10-06 ENCOUNTER — Ambulatory Visit (HOSPITAL_COMMUNITY)
Admission: EM | Admit: 2023-10-06 | Discharge: 2023-10-06 | Disposition: A | Discharge status: Home / Self Care | Payer: Medicare Other | Attending: Physician Assistant | Admitting: Physician Assistant

## 2023-10-06 ENCOUNTER — Encounter (HOSPITAL_COMMUNITY): Payer: Self-pay

## 2023-10-06 DIAGNOSIS — R062 Wheezing: Secondary | ICD-10-CM | POA: Diagnosis not present

## 2023-10-06 DIAGNOSIS — J069 Acute upper respiratory infection, unspecified: Secondary | ICD-10-CM

## 2023-10-06 MED ORDER — ACETAMINOPHEN 325 MG PO TABS
650.0000 mg | ORAL_TABLET | Freq: Once | ORAL | Status: AC
  Administered 2023-10-06: 650 mg via ORAL

## 2023-10-06 MED ORDER — AZITHROMYCIN 250 MG PO TABS: ORAL_TABLET | ORAL | 0 refills | Status: AC

## 2023-10-06 MED ORDER — AMOXICILLIN-POT CLAVULANATE 875-125 MG PO TABS: 1.0000 | ORAL_TABLET | Freq: Two times a day (BID) | ORAL | 0 refills | Status: AC

## 2023-10-06 MED ORDER — ACETAMINOPHEN 325 MG PO TABS
ORAL_TABLET | ORAL | Status: AC
  Filled 2023-10-06: qty 2

## 2023-10-06 MED ORDER — PREDNISONE 20 MG PO TABS: ORAL_TABLET | ORAL | 0 refills | Status: AC

## 2023-10-06 NOTE — ED Triage Notes (Signed)
 Patient reports he is cough of mucous x 1 week. Denies any other symptoms.

## 2023-10-06 NOTE — ED Provider Notes (Addendum)
 [facility]    CSN: [id number] Arrival date & time: 10/06/23  1501      History   Chief Complaint No chief complaint on file.   HPI Harold Myers is a 58 y.o. male.   HPI   He reports he has had cold symptoms since Monday  He denies known recent sick contacts  He reports he is having productive coughing that is worse at night and early in the AM  He states coughing is better during the daytime   He also reports somewhat scratchy throat, fever, chills,    Interventions: Tylenol, Norel?  He has tried Coricidin and tea with honey as well He has also used Vicks cough drops     Past Medical History:  Diagnosis Date   Allergy    Arthritis    Diverticulosis    Gout    Herniated cervical disc    Hyperlipidemia    Hypertension    Sleep apnea    wears CPAP   Tubular adenoma of colon     There are no active problems to display for this patient.   Past Surgical History:  Procedure Laterality Date   SHOULDER SURGERY Left    rotator cuff, bone spur- left shoulder       Home Medications    Prior to Admission medications   Medication Sig Start Date End Date Taking? Authorizing Provider  amoxicillin-clavulanate (AUGMENTIN) 875-125 MG tablet Take 1 tablet by mouth every 12 (twelve) hours. 10/06/23  Yes Manasseh Pittsley E, PA-C  aspirin 81 MG chewable tablet Chew 81 mg by mouth daily.   Yes [provider]  azithromycin (ZITHROMAX) 250 MG tablet Take 500mg  PO daily x1d and then 250mg  daily x4 days 10/06/23  Yes Shoaib Siefker E, PA-C  bisacodyl (DULCOLAX) 5 MG EC tablet Take 5 mg by mouth daily as needed for moderate constipation.   Yes [provider]  gabapentin (NEURONTIN) 300 MG capsule Take 300 mg by mouth 3 (three) times daily as needed.   Yes [provider]  loratadine (CLARITIN) 10 MG tablet Take 10 mg by mouth daily. Reported on 09/23/2015   Yes [provider]  losartan (COZAAR) 50 MG tablet Take 50 mg by mouth  daily. 07/22/20  Yes [provider]  pantoprazole (PROTONIX) 20 MG tablet TAKE 1 TABLET BY MOUTH EVERY DAY 04/08/21  Yes Esterwood, Amy S, PA-C  predniSONE (DELTASONE) 20 MG tablet Take 60mg  PO daily x 2 days, then40mg  PO daily x 2 days, then 20mg  PO daily x 3 days 10/06/23  Yes Carlea Badour E, PA-C  simvastatin (ZOCOR) 20 MG tablet Take 20 mg by mouth daily at 6 PM.   Yes [provider]  acetaminophen (TYLENOL) 500 MG tablet Take 500 mg by mouth every 6 (six) hours as needed.    [provider]  Chlorphen-PE-Acetaminophen (NOREL AD PO) Take by mouth.    [provider]  Digestive Enzymes (ENZYME DIGEST PO) Take by mouth 2 (two) times daily.    [provider]  ergocalciferol (VITAMIN D2) 50000 units capsule Take 50,000 Units by mouth once a week.    [provider]  febuxostat (ULORIC) 40 MG tablet Take 40 mg by mouth daily.    [provider]  fluticasone (FLONASE) 50 MCG/ACT nasal spray Place 2 sprays into both nostrils daily.    [provider]  Lactobacillus-Inulin (CULTURELLE DIGESTIVE DAILY PO) Take by mouth.    [provider]  sildenafil (VIAGRA) 100  MG tablet Take 100 mg by mouth daily as needed for erectile dysfunction.    [provider]  famotidine (PEPCID) 20 MG tablet Take 20 mg by mouth daily.  10/16/20  [provider]    Family History Family History  Problem Relation Age of Onset   Arthritis Mother    Breast cancer Mother    Diabetes Mother    Heart disease Mother    Arthritis Father    Arthritis Sister    Breast cancer Sister    Alcohol abuse Brother    Arthritis Brother    Diabetes Brother    Heart disease Brother    Prostate cancer Brother    Colon cancer Neg Hx    Esophageal cancer Neg Hx    Rectal cancer Neg Hx    Stomach cancer Neg Hx     Social History Social History   Tobacco Use   Smoking status: Never   Smokeless tobacco: Never  Vaping Use   Vaping  status: Never Used  Substance Use Topics   Alcohol use: Not Currently   Drug use: Never     Allergies   Patient has no known allergies.   Review of Systems Review of Systems  Constitutional:  Positive for chills and fever.  HENT:  Positive for congestion, postnasal drip and rhinorrhea. Negative for ear pain and sore throat.   Respiratory:  Positive for cough. Negative for shortness of breath and wheezing.   Gastrointestinal:  Negative for diarrhea, nausea and vomiting.  Musculoskeletal:  Negative for myalgias.  Neurological:  Positive for headaches. Negative for dizziness and light-headedness.     Physical Exam Triage Vital Signs ED Triage Vitals [10/06/23 1535]  Encounter Vitals Group     BP 114/81     Systolic BP Percentile      Diastolic BP Percentile      Pulse Rate 78     Resp 18     Temp (!) 102.7 F (39.3 C)     Temp Source Oral     SpO2 95 %     Weight      Height      Head Circumference      Peak Flow      Pain Score      Pain Loc      Pain Education      Exclude from Growth Chart    No data found.  Updated Vital Signs BP 114/81 (BP Location: Left Arm)   Pulse 78   Temp (!) 102.7 F (39.3 C) (Oral)   Resp 18   SpO2 95%   Visual Acuity Right Eye Distance:   Left Eye Distance:   Bilateral Distance:    Right Eye Near:   Left Eye Near:    Bilateral Near:     Physical Exam Vitals reviewed.  Constitutional:      General: He is awake.     Appearance: Normal appearance. He is well-developed and well-groomed.  HENT:     Head: Normocephalic and atraumatic.  Cardiovascular:     Rate and Rhythm: Normal rate and regular rhythm.     Heart sounds: Normal heart sounds. No murmur heard.    No friction rub. No gallop.  Pulmonary:     Effort: Pulmonary effort is normal.     Breath sounds: Decreased air movement present. Decreased breath sounds and wheezing present. No rhonchi or rales.  Lymphadenopathy:     Head:     Right side of head: No  submental, submandibular or preauricular adenopathy.     Left side of head: No submental, submandibular or preauricular adenopathy.     Cervical:     Right cervical: No superficial cervical adenopathy.    Left cervical: No superficial cervical adenopathy.  Neurological:     Mental Status: He is alert.  Psychiatric:        Behavior: Behavior is cooperative.      UC Treatments / Results  Labs (all labs ordered are listed, but only abnormal results are displayed) Labs Reviewed - No data to display  EKG   Radiology No results found.  Procedures Procedures (including critical care time)  Medications Ordered in UC Medications  acetaminophen (TYLENOL) tablet 650 mg (650 mg Oral Given 10/06/23 1539)    Initial Impression / Assessment and Plan / UC Course  I have reviewed the triage vital signs and the nursing notes.  Pertinent labs & imaging results that were available during my care of the patient were reviewed by me and considered in my medical decision making (see chart for details).      Final Clinical Impressions(s) / UC Diagnoses   Final diagnoses:  Wheezing  URI with cough and congestion   Acute, new concern Patient presents today with concerns for postnasal drainage, and nasal congestion, productive coughing, sore throat, fever and chills since Monday.  He denies improvement with home measures.  Patient is outside therapeutic window for antivirals.  Rapid COVID and flu testing was deferred today as this will not add to workup.  Physical exam was notable for decreased air movement, decreased breath sounds and wheezing as well as fever.  At this time I am concerned for potential community-acquired pneumonia.  Will start regimen comprised of Augmentin and Z-Pak.  Will also send in prednisone taper to assist with breathing.  Recommend that he does not take colchicine while taking the Z-Pak due to medication interaction.  These instructions were provided in after visit  summary.    Recommend taking over-the-counter medications as needed for symptomatic management while antibiotics and prednisone are reaching therapeutic levels.  ED and return precautions were reviewed and provided in after visit summary.  Follow-up as needed for progressing or persistent symptoms    Discharge Instructions      At this time I suspect that you likely have an upper respiratory infection.  However on your physical exam you did have some wheezing and decreased air movement that makes me concerned for potential pneumonia.  I have sent in a medication regimen for you comprised of 2 antibiotics as well as prednisone.  The antibiotics are to cover you for potential pneumonia.  The prednisone is a steroid to help open up your airways and help you breathe easier. I have sent in a script for Prednisone taper to be taken in the morning with breakfast per the instructions on the container Remember that steroids can cause sleeplessness, irritability, increased hunger and elevated glucose levels so be mindful of these side effects. They should lessen as you progress to the lower doses of the taper.  While you are taking the Z-Pak please do not take colchicine as these medications interact with each other and can cause concerns and side effects.  You can take over-the-counter medications such as Robitussin, Mucinex, Tylenol as needed for symptomatic management.  You can also use Flonase and sterile nasal saline sprays to help with nasal congestion and rhinorrhea or runny nose. If it anytime you start to develop fevers that are not responding  to over-the-counter medications, trouble breathing, wheezing, chest pain, feeling like you are going to pass out, signs of dehydration please go immediately to the emergency room for further evaluation and management.      ED Prescriptions     Medication Sig Dispense Auth. Provider   amoxicillin-clavulanate (AUGMENTIN) 875-125 MG tablet Take 1 tablet by  mouth every 12 (twelve) hours. 14 tablet Omesha Bowerman E, PA-C   azithromycin (ZITHROMAX) 250 MG tablet Take 500mg  PO daily x1d and then 250mg  daily x4 days 6 each Sarika Baldini E, PA-C   predniSONE (DELTASONE) 20 MG tablet Take 60mg  PO daily x 2 days, then40mg  PO daily x 2 days, then 20mg  PO daily x 3 days 13 tablet Mathieu Schloemer E, PA-C      PDMP not reviewed this encounter.   Amyria Komar, Oswaldo Conroy, PA-C 10/06/23 2106    Nymir Ringler, Oswaldo Conroy, PA-C 10/06/23 2107

## 2023-10-06 NOTE — Discharge Instructions (Addendum)
 At this time I suspect that you likely have an upper respiratory infection.  However on your physical exam you did have some wheezing and decreased air movement that makes me concerned for potential pneumonia.  I have sent in a medication regimen for you comprised of 2 antibiotics as well as prednisone.  The antibiotics are to cover you for potential pneumonia.  The prednisone is a steroid to help open up your airways and help you breathe easier. I have sent in a script for Prednisone taper to be taken in the morning with breakfast per the instructions on the container Remember that steroids can cause sleeplessness, irritability, increased hunger and elevated glucose levels so be mindful of these side effects. They should lessen as you progress to the lower doses of the taper.  While you are taking the Z-Pak please do not take colchicine as these medications interact with each other and can cause concerns and side effects.  You can take over-the-counter medications such as Robitussin, Mucinex, Tylenol as needed for symptomatic management.  You can also use Flonase and sterile nasal saline sprays to help with nasal congestion and rhinorrhea or runny nose. If it anytime you start to develop fevers that are not responding to over-the-counter medications, trouble breathing, wheezing, chest pain, feeling like you are going to pass out, signs of dehydration please go immediately to the emergency room for further evaluation and management.
